# Patient Record
Sex: Female | Born: 1960 | Race: White | Hispanic: No | Marital: Married | Smoking: Former smoker
Health system: Southern US, Community
[De-identification: ages and names within clinical notes are randomized; demographics above are authoritative.]

## PROBLEM LIST (undated history)

## (undated) DIAGNOSIS — E119 Type 2 diabetes mellitus without complications: Secondary | ICD-10-CM

## (undated) DIAGNOSIS — R609 Edema, unspecified: Secondary | ICD-10-CM

## (undated) DIAGNOSIS — K219 Gastro-esophageal reflux disease without esophagitis: Secondary | ICD-10-CM

## (undated) DIAGNOSIS — J309 Allergic rhinitis, unspecified: Secondary | ICD-10-CM

## (undated) DIAGNOSIS — F419 Anxiety disorder, unspecified: Secondary | ICD-10-CM

## (undated) DIAGNOSIS — E7800 Pure hypercholesterolemia, unspecified: Secondary | ICD-10-CM

## (undated) DIAGNOSIS — E785 Hyperlipidemia, unspecified: Secondary | ICD-10-CM

## (undated) DIAGNOSIS — K760 Fatty (change of) liver, not elsewhere classified: Secondary | ICD-10-CM

## (undated) DIAGNOSIS — I10 Essential (primary) hypertension: Secondary | ICD-10-CM

## (undated) DIAGNOSIS — U071 COVID-19: Secondary | ICD-10-CM

## (undated) DIAGNOSIS — H9319 Tinnitus, unspecified ear: Secondary | ICD-10-CM

## (undated) DIAGNOSIS — I700 Atherosclerosis of aorta: Secondary | ICD-10-CM

## (undated) DIAGNOSIS — E559 Vitamin D deficiency, unspecified: Secondary | ICD-10-CM

## (undated) HISTORY — DX: Type 2 diabetes mellitus without complications: E11.9

## (undated) HISTORY — DX: Hyperlipidemia, unspecified: E78.5

## (undated) HISTORY — DX: Atherosclerosis of aorta: I70.0

## (undated) HISTORY — PX: HERNIA REPAIR: SHX51

## (undated) HISTORY — DX: Morbid (severe) obesity due to excess calories: E66.01

## (undated) HISTORY — DX: Pure hypercholesterolemia, unspecified: E78.00

## (undated) HISTORY — DX: COVID-19: U07.1

## (undated) HISTORY — DX: Essential (primary) hypertension: I10

## (undated) HISTORY — DX: Tinnitus, unspecified ear: H93.19

## (undated) HISTORY — DX: Edema, unspecified: R60.9

## (undated) HISTORY — DX: Vitamin D deficiency, unspecified: E55.9

## (undated) HISTORY — DX: Allergic rhinitis, unspecified: J30.9

## (undated) HISTORY — DX: Anxiety disorder, unspecified: F41.9

## (undated) HISTORY — DX: Fatty (change of) liver, not elsewhere classified: K76.0

## (undated) HISTORY — DX: Gastro-esophageal reflux disease without esophagitis: K21.9

## (undated) HISTORY — PX: BACK SURGERY: SHX140

## (undated) HISTORY — PX: PARTIAL HYSTERECTOMY: SHX80

## (undated) HISTORY — PX: CHOLECYSTECTOMY: SHX55

---

## 1998-04-05 ENCOUNTER — Ambulatory Visit (HOSPITAL_COMMUNITY): Admission: RE | Admit: 1998-04-05 | Discharge: 1998-04-05 | Payer: Self-pay | Admitting: Family Medicine

## 1999-09-24 ENCOUNTER — Encounter: Admission: RE | Admit: 1999-09-24 | Discharge: 1999-09-24 | Payer: Self-pay | Admitting: Family Medicine

## 1999-09-24 ENCOUNTER — Encounter: Payer: Self-pay | Admitting: Family Medicine

## 2000-12-06 ENCOUNTER — Other Ambulatory Visit: Admission: RE | Admit: 2000-12-06 | Discharge: 2000-12-06 | Payer: Self-pay | Admitting: Obstetrics and Gynecology

## 2001-02-24 ENCOUNTER — Encounter (INDEPENDENT_AMBULATORY_CARE_PROVIDER_SITE_OTHER): Payer: Self-pay | Admitting: Specialist

## 2001-02-24 ENCOUNTER — Inpatient Hospital Stay (HOSPITAL_COMMUNITY): Admission: RE | Admit: 2001-02-24 | Discharge: 2001-02-26 | Payer: Self-pay | Admitting: Obstetrics and Gynecology

## 2002-09-06 ENCOUNTER — Encounter: Payer: Self-pay | Admitting: Orthopedic Surgery

## 2002-09-07 ENCOUNTER — Inpatient Hospital Stay (HOSPITAL_COMMUNITY): Admission: RE | Admit: 2002-09-07 | Discharge: 2002-09-08 | Payer: Self-pay | Admitting: Orthopedic Surgery

## 2003-11-12 ENCOUNTER — Other Ambulatory Visit: Admission: RE | Admit: 2003-11-12 | Discharge: 2003-11-12 | Payer: Self-pay | Admitting: Family Medicine

## 2004-10-30 ENCOUNTER — Other Ambulatory Visit: Admission: RE | Admit: 2004-10-30 | Discharge: 2004-10-30 | Payer: Self-pay | Admitting: Obstetrics and Gynecology

## 2005-01-09 ENCOUNTER — Encounter: Admission: RE | Admit: 2005-01-09 | Discharge: 2005-01-09 | Payer: Self-pay | Admitting: Obstetrics and Gynecology

## 2006-03-15 ENCOUNTER — Ambulatory Visit (HOSPITAL_COMMUNITY): Admission: RE | Admit: 2006-03-15 | Discharge: 2006-03-15 | Payer: Self-pay | Admitting: Gastroenterology

## 2006-03-17 ENCOUNTER — Emergency Department (HOSPITAL_COMMUNITY): Admission: EM | Admit: 2006-03-17 | Discharge: 2006-03-17 | Payer: Self-pay | Admitting: Emergency Medicine

## 2010-08-24 ENCOUNTER — Encounter: Payer: Self-pay | Admitting: Obstetrics and Gynecology

## 2010-12-19 NOTE — Op Note (Signed)
[facility]  Patient:    Brenda Robinson, Brenda Robinson [facility]                    MRN: [id number] Proc. Date: 02/24/01 Attending:  Guy Sandifer. Arleta Creek, M.D.                           Operative Report  PREOPERATIVE DIAGNOSES: 1. Dysmenorrhea. 2. Cystocele and rectocele.  POSTOPERATIVE DIAGNOSES: 1. Dysmenorrhea. 2. Cystocele and rectocele.  PROCEDURE:  Laparoscopically assisted vaginal hysterectomy with anterior posterior vaginal repair.  SURGEON:  Guy Sandifer. Arleta Creek, M.D.  ASSISTANT:  Raynald Kemp, M.D.  ANESTHESIA:  General with endotracheal intubation.  ESTIMATED BLOOD LOSS:  150 cc.  INDICATIONS AND CONSENTS:  The patient is a 50 year old married white female G2, P2, husband status post vasectomy with decreasingly severe premenstrual pain and dysmenorrhea as well as symptoms of cystocele and rectocele. She is having increasing difficulty as well evacuating her bowels. Laparoscopically assisted vaginal hysterectomy and anterior posterior vaginal repair is discussed. An ovary will be removed only if distinctly abnormal. Potential complications have been discussed including but not limited to infection, bowel, bladder and ureteral damage, bleeding requiring transfusion of blood products with possible transfusion reaction HIV and hepatitis acquisition, DVT, PE, pneumonia, fistula formation, laparotomy, and postoperative dyspareunia. All questions are answered and consent is signed on the chart.  FINDINGS:  Upper abdomen is grossly normal. The uterus is retroverted and large to approximately 10 week size. Ovaries are normal. Anterior posterior cul-de-sacs and pelvic sidewalls are normal.  DESCRIPTION OF PROCEDURE:  The patient is taken to the operating room, placed in the dorsal supine position where general anesthesia is induced via endotracheal intubation. She was then placed in the dorsal lithotomy position where she was prepped abdominally and vaginally.  The bladder was straight catheterized, Hulka tenaculum was placed in the uterus as a manipulator and she is draped in a sterile fashion. A small infraumbilical incision is made and a 10/11 disposable trocar sleeve is placed on the first attempt without difficulty. Placement is verified with the laparoscope and no damage to the surrounding structures is noted. Pneumoperitoneum is induced and then a small suprapubic and later left lower quadrant incisions are made after transillumination and 5 mm nondisposable trocar sleeves are placed under direct visualization without difficulty. The above findings are noted. Then using the 5 mm laparoscope to the left lower quadrant trocar sleeve, the ligasure instrument is used to take down the proximal ligaments bilaterally. Switching back to the operative laparoscope, the vesicouterine peritoneum is incised in the midline, hydrodissected and taken down cephalolaterally. Good hemostasis is noted. Instruments are removed and attention is turned to the vagina. The posterior cul-de-sac is entered sharply, the cervix is circumscribed with a scalpel and the mucosa is advanced bluntly and sharply. The uterosacral ligaments followed by the bladder pillars and the cardinal ligaments are taken down bilaterally with a ligasure instrument. This is followed by the uterine vessels and one bite above the level of the uterine vessels bilaterally. The fundus is delivered posterior, the proximal ligaments are clamped and the specimen is delivered. Bleeding at the 3 oclock position of the cuff is controlled with a figure-of-eight #0 monocryl suture. All suture will be #0 monocryl unless otherwise designated. The uterosacral ligaments are then plicated in the vaginal cuff bilaterally and are then plicated in the midline with a single suture. The posterior half of the vaginal cuff is closed.  Good hemostasis is noted. The anterior vaginal mucosa is then dissected from the  underlying bladder in the midline sharply. This is then carried out bilaterally, sharply and bluntly. The base of the bladder is then reduced with a single pursestring suture and then the vesicovaginal fascia is reapproximated in the midline with interrupted sutures. At this point, Dr. Annabell Howells enters the room and performs his pubovaginal sling procedure and this is dictated in a separate note. At this point, I resumed surgery and the anterior half of the vaginal cuff is closed with figure-of-eight #0 monocryls. Excess vaginal mucosa is trimmed and the anterior vaginal mucosa is closed in a running locking fashion with 2-0 monocryl suture. Then a posterior vaginal repair is performed first removing a shallow diamond shaped wedge of tissue over the perineal body and dissecting the posterior vaginal mucosa from the underlying rectum in the midline. This is again dissected bilaterally, sharply and bluntly. The rectovaginal fascia is reapproximated in the midline with interrupted #0 monocryl. Excess tissue was removed and the vaginal mucosa is reapproximated in running locking fashion with 2-0 monocryl suture down to the level of the vaginal inlets. The perineal body is then dissected out bilaterally and reapproximated with interrupted #0 monocryl suture. The 2-0 suture is then used to continue closure of the mucosa and skin in a standard episiotomy type fashion. Good hemostasis is again noted. One inch iodoform packing is then placed in the vagina. Returning attention to the abdomen, pneumoperitoneum is recreated and careful irrigation inspection is carried out. Some bleeding at the level of the anterior vaginal cuff is controlled with bipolar cautery without difficulty. Excess fluid is removed, pneumoperitoneum is reduced and no bleeding is noted from any site. The inferior trocar sleeves are removed and again no bleeding is noted under reduced pneumoperitoneum. Pneumoperitoneum is then  completely reduced and all trocar sleeves are removed. The umbilical incision is closed first with a #0 Vicryl and the deeper underlying tissues with care being taken not to pick up  any underlying structures. The skin incisions are closed with subcuticular 3-0 Vicryl suture. The incisions are injected with 0.5% plain Marcaine. All counts are correct. The patient is awakened and taken to the recovery room in stable condition. DD:  02/24/01 TD:  02/24/01 Job: [id number]

## 2010-12-19 NOTE — Op Note (Signed)
[name], [name]                       ACCOUNT NO.:  [id number]   MEDICAL RECORD NO.:  [id number]                   PATIENT TYPE:  AMB   LOCATION:  DAY                                  FACILITY:  [facility]   PHYSICIAN:  Georges Lynch. Darrelyn Hillock, M.D.             DATE OF BIRTH:  [birth year]   DATE OF PROCEDURE:  09/06/2002  DATE OF DISCHARGE:                                 OPERATIVE REPORT   SURGEON:  Georges Lynch. Darrelyn Hillock, M.D.   ASSISTANT:  Jene Every, M.D.   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 Note little history, she had severe pain in her back radiating to both  buttocks and down both legs and the plantar aspect of both feet.   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 Note little history, she had severe pain in her back radiating to both  buttocks and down both legs and the plantar aspect of both feet.   OPERATION:  1. Bilateral hemilaminectomies L5-S1.  2. Microdiskectomies bilaterally at L5-S1 with foraminotomies bilaterally.   DESCRIPTION OF PROCEDURE:  Under general anesthesia the patient on a spinal  frame, a routine orthopedic prep and draping of the back was carried out.  The patient had 1 gm of IV Ancef preop. Two needles were placed in the back  for localization purposes, an x-ray was taken and we verified the L5-S1  position. We then made our incision over L5-S1, carried the incision down to  the lamina bilaterally of L5-S1 and to the sacrum. Another x-ray was taken  to verify our position. We then went down and carried out bilateral  hemilaminectomies at this time utilizing the Kerrison rongeurs. Great care  was taken not to injure the underlying dura. We then removed the ligamentum  flavum. We did one side at a time. We first approached the left side. We  inserted the D'Errico retractors, gently retracted the dura, identified the  nerve root and then identified the lateral  recess veins and cauterized the  veins with the bipolar. We then identified the disk rupture, a cruciate  incision was made in the disk space and a complete diskectomy was carried  out in the left. We then went over to the right and did the exact same  thing. We did foraminotomies bilaterally. We thoroughly decompressed the  nerve roots. We utilized the Epstein curettes to go up into the midline to  make sure there were no other fragments in the midline. We did a nice  diskectomy, we thoroughly removed all the disk material, we cleaned out the  area well, irrigated the area, dried the area out and then loosely applied  some thrombin soaked Gelfoam for hemostasis purposes. I then closed the  wound in layers in the usual fashion. I left the superior deep portion of  the fascia open for drainage purposes in case  there was any bleeding after  the procedure. We then closed the remaining part of the wound in layers in  the usual fashion. A sterile Neosporin dressing was applied. The patient  left the operating room in satisfactory condition.                                               Ronald A. Darrelyn Hillock, M.D.    RAG/MEDQ  D:  09/06/2002  T:  09/06/2002  Job:  [id number]

## 2010-12-19 NOTE — Op Note (Signed)
[facility]  Patient:    Brenda Robinson, Brenda Robinson [facility]                    MRN: [id number] Proc. Date: 02/24/01 Attending:  Excell Seltzer. Annabell Howells, M.D. CC:         Guy Sandifer. Arleta Creek, M.D.   Operative Report  PROCEDURE:  Pubovaginal sling.  PREOPERATIVE DIAGNOSIS:  Stress incontinence.  POSTOPERATIVE DIAGNOSIS:  Stress incontinence.  SURGEON:  Excell Seltzer. Annabell Howells, M.D.  ASSISTANT:  Guy Sandifer. Arleta Creek, M.D.  ANESTHESIA:  General.  DRAINS:  Suprapubic tube.  COMPLICATIONS:  None.  INDICATIONS:  Ms. Brenda Robinson is a 50 year old white female with dysmenorrhea and premenstrual pain who is to undergo hysterectomy.  She has had progressive stress incontinence and has elected to have a sling procedure done at the time of her hysterectomy and AP repair.  FINDINGS AT PROCEDURE:  The patient had been given preoperative antibiotics and taken to the operating room where a general anesthetic was induced.  She was placed in the lithotomy position.  A laparoscopically-assisted vaginal hysterectomy had been performed by Dr. Henderson Cloud.  An anterior repair was performed, the left anterior vaginal opened. I then came into the procedure.  A Foley catheter was inserted, and the bladder was drained.  A transverse incision was made just superior to the pubis in the midline, measuring approximately 3 cm in length.  The fat was spread to the fascia with a Kelly, and a moist sponge was placed in the wound. A 2 x 8 cm Dermatrix strip was soaked and prepared, and then a #1 Prolene was placed thorough each end with a quadruple helical stitch.  Once the strip was prepared, vaginal dissection was performed.  The anterior vaginal wall incision was extended out towared the mid urethral level.  The pubourethral fascia was initially pierced on the right side at its junction with the pubis, and a finger was passed in the retropubic space.  This was then repeated on the left side.  Once the dissection had  been completed, the Raz needle was passed on the right side from the abdominal incision down to the vaginal incision under finger guidance.  Once in the vaginal area, the suspension sutures were placed through the eye of the Raz needle and withdrawn to the abdominal incision.  This was then repeated on the left.  The Dermatrix strip was then tacked over the bladder neck and proximal urethra with 3-0 Vicryl tacking sutures.  Cystoscopy was then performed.  Examination revealed no evidence of bladder injury and good position of the sling.  The bladder was filled; the patient was placed in Trendelenburg, and a microvasive fader tip suprapubic tube was placed through a separate stab wound just superior to the suprapubic incision.  This tube was placed under cystoscope guidance.  Once in good position, the trocar was removed, and the coil was formed and secured, and the bladder was then drained.  The cystoscope sheath was left in the urethra to provide appropriate position reference.  The suspension sutures were then tied under minimal tension over hemostats and then tied across the midline to each other.  The suture was trimmed, and the knot was stuck back into the abdominal incision.  The abdominal incision was then closed with a running intracuticular 4-0 Vicryl stitch.  At this point, the suprapubic tube was placed to straight drainage, and Dr. Henderson Cloud then took over and completed his portion of the procedure.  There were no complications during  the sling procedure. DD:  02/24/01 TD:  02/24/01 Job: [id number]

## 2010-12-19 NOTE — H&P (Signed)
Brenda Robinson, Brenda Robinson [facility]                       ACCOUNT NO.:  [id number]   MEDICAL RECORD NO.:  [id number]                   PATIENT TYPE:  AMB   LOCATION:  DAY                                  FACILITY:  [facility]   PHYSICIAN:  Georges Lynch. Gioffre, M.D.             DATE OF BIRTH:  10/23/1960   DATE OF ADMISSION:  DATE OF DISCHARGE:                                HISTORY & PHYSICAL   HISTORY:  The patient has had low back pain for the past 3-4 months. The  patient initially started having back pain in August 2003, pain worsened in  October and she sought care from Dr. Darrelyn Hillock in November. The pain has been  increasing in severity, radiates into her buttocks and down into both legs.  Describes the pain as a burning type sensation. The patient had an MRI back  in 2002 and she had a small disk bulge at that time. It was felt that she  would need to be restudied in order to see if she had increasing disk bulge  or herniation which is causing her back pain. Due to the patient's  progressive symptoms, increasing pain, an MRI was ordered which revealed  left L5-S1 disk herniation with inferior extrusion resulting in impingement  on the left S1 nerve root. The patient has elected to proceed with surgery.   ALLERGIES:  DILAUDID causes itching, CODEINE causes itching.   CURRENT MEDICATIONS:  1. Ultracet.  2. Robaxin.  3. Percocet.  4. Prilosec 20 mg q.a.m.  5. Prozac 20 mg daily.   PAST MEDICAL HISTORY:  1. Gastroesophageal reflux disease.  2. Depression.   PAST SURGICAL HISTORY:  The patient had a hysterectomy with bladder repair  in July of 2002.   PRIMARY CARE PHYSICIAN:  Brenda Robinson, M.D.   SOCIAL HISTORY:  The patient is married, works in a [employer], does not  smoke. Minimal alcohol use.   FAMILY HISTORY:  Mother has hypertension.   REVIEW OF SYMPTOMS:  GENERAL:  Denies weight change, fever, chills, fatigue.  HEENT:  Denies headache, visual changes, tinnitus, hearing  loss, sore  throat. CARDIOVASCULAR:  Denies chest pain, palpitations, shortness of  breath, hypertension. PULMONARY:  Denies dyspnea, wheezing, cough,  hemoptysis or  sputum production. GI:  Denies dysphagia, nausea, vomiting,  hematemesis or abdominal pain. GU:  Denies dysuria, frequency, incontinence,  nocturia, or hematuria. ENDOCRINE:  Denies polyuria, polydipsia, appetite  change, heat or cold intolerance. MUSCULOSKELETAL:  Does have bilateral leg  pain radiating from her back. NEUROLOGIC:  Denies dizziness, vertigo,  syncope, numbness, weakness or paresthesias. SKIN:  Denies itching, rashes,  masses or moles.   PHYSICAL EXAMINATION:  VITAL SIGNS:  Temperature 98.4, pulse 84,  respirations 18, blood pressure 120/84 right arm sitting.  GENERAL:  A 50 year old female in no acute distress.  HEENT:  PERRL.  EOM intact. Pharynx clear.  NECK:  Supple without masses.  CHEST:  Clear to  auscultation bilaterally. No wheezing, rales or rhonchi  noted.  HEART:  Regular rate and rhythm without murmur, gallop or rub.  ABDOMEN:  Positive bowel sounds, soft, nontender, no organomegaly or  abnormal masses.  BACK:  The patient has decreased range of motion.  EXTREMITIES:  Straight leg raise is negative bilaterally. DTRs are 2+ all  extremities.  SKIN:  Warm and dry.   MRI revealed left L5-S1 disk herniation with inferior extrusion resulting in  impingement on the left S1 nerve root.   IMPRESSION:  Herniated disk L5-S1.   PLAN:  The patient is to be admitted to [facility] to undergo  bilateral hemilaminectomies and microdiskectomies at L5-S1. The patient will  be admitted September 06, 2002.     Ebbie Ridge. Paitsel, P.A.                     Ronald A. Darrelyn Hillock, M.D.    Tilden Dome  D:  09/05/2002  T:  09/05/2002  Job:  [id number]

## 2010-12-19 NOTE — H&P (Signed)
[facility]  Patient:    Brenda Robinson, Brenda Robinson                        MRN: [id number] Adm. Date:  02/24/01 Attending:  Guy Sandifer. Arleta Creek, M.D.                         History and Physical  CHIEF COMPLAINT:   Pelvic pain, dysmenorrhea and stress urinary incontinence.  HISTORY OF PRESENT ILLNESS:  This patient is a 50 year old married white female, G2, P2, and her husband is status post vasectomy who has increasingly bad premenstrual pain and dysmenorrhea.  Pain begins 2 weeks before menses and becomes severe during her menstrual flow.  She has 1 week out of the month where she feels okay.  She also has increasingly severe stress incontinence where she is regularly soiling her clothes.  After careful discussion of the options the patient is being admitted for laparoscopically assisted vaginal hysterectomy with removal of an ovary only if distinctly abnormal, anterior posterior repair and a sling procedure per Dr. Bjorn Pippin.  PAST MEDICAL HISTORY:  Esophageal reflux disease.  PAST SURGICAL HISTORY:  Dilation of urethra at age 83.  Tonsillectomy at age 45.  OBSTETRICAL HISTORY:  Vaginal delivery x 2.  SOCIAL HISTORY:  The patient quit smoking 5 years ago, denies alcohol or drug abuse.  MEDICATIONS: 1. Prevacid daily. 2. Effexor 75 mg daily.  ALLERGIES:  No known drug allergies.  FAMILY HISTORY:  Positive for basal cell carcinoma, maternal grandmother. Chronic hypertension mother.  Coronary artery disease paternal grandmother. Coronary artery disease maternal uncle.  Arrhythmia maternal aunt.  REVIEW OF SYSTEMS:  Negative except as above.  PHYSICAL EXAMINATION:  VITAL SIGNS:  Height 5 feet 2-1/2 inches.  Weight 160 pounds.  Blood pressure 120/72.  HEENT:  Without thyromegaly.  LUNGS:  Clear to auscultation.  HEART:  Regular rate and rhythm.  BACK:  No CVA tenderness.  BREASTS:  Without masses, retraction, discharge.  ABDOMEN:  Soft,  nontender without masses.  PELVIC EXAMINATION:  Vulva, vagina, cervix without lesions.  First degree cystocele.  Uterus is anteverted and normal in size with first degree prolapse at most.  Adnexa nontender without masses.  Rectal exam reveals a small rectocele.  There is some laxity in the vaginal outlet.  ASSESSMENT:  Premenstrual pain, dysmenorrhea, cystocele, stress urinary incontinence.  PLAN:  Microscopically assisted vaginal hysterectomy with removal of an ovary only if distinctly abnormal.  Anterior posterior vaginal repair and a sling procedure per Dr. Bjorn Pippin. DD:  02/02/01 TD:  02/02/01 Job: [id number]

## 2010-12-19 NOTE — Discharge Summary (Signed)
[facility]  Patient:    Brenda Robinson, Brenda Robinson [facility]                    MRN: [id number] Adm. Date:  [id number] Disc. Date: [id number] Attending:  Cordelia Pen Ii CC:         Excell Seltzer. Annabell Howells, M.D.   Discharge Summary  ADMITTING DIAGNOSES:  Dysmenorrhea, cystocele, rectocele, and stress urinary incontinence.  DISCHARGE DIAGNOSES:  Dysmenorrhea, cystocele, rectocele, and stress urinary incontinence.  PROCEDURES:  On February 24, 2001, laparoscopic-assisted vaginal hysterectomy, anterior and posterior vaginal repair, and pubovaginal sling.  REASON FOR ADMISSION:  This patient is a 50 year old white married female, G2, P2, husband status post vasectomy with increasingly severe dysmenorrhea, stress urinary incontinence, and symptoms of cystocele and rectocele.  Details are dictated in the history and physical.  HOSPITAL COURSE:  She was admitted to the hospital and underwent the above procedures without complication.  Estimated blood loss was 150 cc.  On the evening of surgery, she was afebrile with good pain relief.  She did have itching with Dilaudid which was resolved when she was switched to morphine. On the first postoperative day, she was afebrile.  Hemoglobin was 9.1, white count 8.7.  On the day of discharge, she was doing well.  Voiding trials were going well.  She was ambulating and tolerating a regular diet.  CONDITION ON DISCHARGE:  Good.  DIET:  Regular as tolerated.  ACTIVITY:  No lifting or operation of automobiles, no vaginal entry.  She is to call the office for problems including, but not limited to, temperature of 100.4 degrees, persistent nausea or vomiting, increasing pain, or heavy vaginal bleeding.  FOLLOW-UP:  In the office in two weeks.  DISCHARGE MEDICATIONS: 1. Percocet 5/325 #30, 1-2 p.o. q.6h. p.r.n. no refills. 2. Bactrim DS 1 p.o. b.i.d. #14 no refills. 3. Restoril 30 mg #20, 1 p.o. q.h.s. p.r.n. no refills. DD:   02/26/01 TD:  02/27/01 Job: [id number]

## 2010-12-19 NOTE — Op Note (Signed)
NAME:  Brenda Robinson, Brenda Robinson               ACCOUNT NO.:  [id number]   MEDICAL RECORD NO.:  [id number]          PATIENT TYPE:  AMB   LOCATION:  ENDO                         FACILITY:  [facility]   PHYSICIAN:  Anselmo Rod, M.D.  DATE OF BIRTH:  August 02, 1961   DATE OF PROCEDURE:  03/15/2006  DATE OF DISCHARGE:                                 OPERATIVE REPORT   PROCEDURE:  Screening colonoscopy.   ENDOSCOPIST:  Anselmo Rod, M.D.   INSTRUMENT USED:  Olympus video colonoscope.   INDICATION FOR PROCEDURE:  A 50 year old white female with history of change  in bowel habits, undergoing a screening colonoscopy to rule out colonic  polyps, masses, etc.   PREPROCEDURE PREPARATION:  Informed was procured from the patient, and  patient was fasted for 4 hours prior to the procedure, and prepped with 32  OsmoPrep pills the night of and the morning of the procedure.  Risks and  benefits of the procedure, including a 10% miss rate of cancer and polyp,  were discussed with the patient, as well as.   PREPROCEDURE PHYSICAL:  Patient had stable vital signs.  NECK:  Supple.  CHEST:  Clear to auscultation.  S1-S2 regular.  ABDOMEN:  Soft with normal bowel sounds.   DESCRIPTION OF PROCEDURE:  The patient was placed in the left lateral  decubitus position and sedated with 150 mcg of Fentanyl and 12.5 mg of  Versed in slow incremental doses. Once the patient was adequately sedated  and maintained on low flow oxygen and continuous cardiac monitoring, the  Olympus video colonoscope was advanced from the rectum to cecum.  The  appendical orifice and ileocecal valve was clearly visualized and  photographed. The terminal ileum appeared healthy without lesions.  Multiple  washes were done.  The entire colonic mucosa appeared healthy with a normal  vascular pattern.  Retroflexion in the rectum revealed no abnormalities.  No  masses, polyps, erosions, ulcerations or diverticula were seen.  The patient  tolerated the  procedure well without immediate complications.   IMPRESSION:  Normal colonoscopy up to the terminal ileum.  No masses, polyps  or diverticular seen.   RECOMMENDATIONS:  1.Continue high fiber diet with liberal fluid intake.  2.Repeat colonoscopy in the next 10 years unless the patient develops any  abnormal symptoms in the interim.  3.Outpatient follow up as need arises in the future.      Anselmo Rod, M.D.  Electronically Signed     JNM/MEDQ  D:  03/15/2006  T:  03/15/2006  Job:  [id number]   cc:   Marcelino Duster L. Vincente Poli, M.D.  Royetta Crochet, MD

## 2011-04-06 ENCOUNTER — Inpatient Hospital Stay (INDEPENDENT_AMBULATORY_CARE_PROVIDER_SITE_OTHER)
Admission: RE | Admit: 2011-04-06 | Discharge: 2011-04-06 | Disposition: A | Discharge status: Home / Self Care | Payer: Self-pay | Source: Ambulatory Visit | Attending: Emergency Medicine | Admitting: Emergency Medicine

## 2011-04-06 ENCOUNTER — Ambulatory Visit (INDEPENDENT_AMBULATORY_CARE_PROVIDER_SITE_OTHER): Payer: Self-pay

## 2011-04-06 DIAGNOSIS — R05 Cough: Secondary | ICD-10-CM

## 2011-04-06 DIAGNOSIS — R5381 Other malaise: Secondary | ICD-10-CM

## 2011-04-06 DIAGNOSIS — E876 Hypokalemia: Secondary | ICD-10-CM

## 2011-04-06 LAB — POCT I-STAT, CHEM 8
Calcium, Ion: 1.09 mmol/L — ABNORMAL LOW (ref 1.12–1.32)
Chloride: 98 mEq/L (ref 96–112)
Glucose, Bld: 92 mg/dL (ref 70–99)
HCT: 48 % — ABNORMAL HIGH (ref 36.0–46.0)
Hemoglobin: 16.3 g/dL — ABNORMAL HIGH (ref 12.0–15.0)
TCO2: 30 mmol/L (ref 0–100)

## 2013-02-23 ENCOUNTER — Other Ambulatory Visit: Payer: Self-pay | Admitting: Physician Assistant

## 2013-02-23 ENCOUNTER — Other Ambulatory Visit (HOSPITAL_COMMUNITY)
Admission: RE | Admit: 2013-02-23 | Discharge: 2013-02-23 | Disposition: A | Discharge status: Home / Self Care | Payer: No Typology Code available for payment source | Source: Ambulatory Visit | Attending: Family Medicine | Admitting: Family Medicine

## 2013-02-23 DIAGNOSIS — Z0000 Encounter for general adult medical examination without abnormal findings: Secondary | ICD-10-CM | POA: Insufficient documentation

## 2014-09-24 ENCOUNTER — Other Ambulatory Visit: Payer: Self-pay | Admitting: Otolaryngology

## 2014-09-24 DIAGNOSIS — H902 Conductive hearing loss, unspecified: Secondary | ICD-10-CM

## 2014-09-24 DIAGNOSIS — H9312 Tinnitus, left ear: Secondary | ICD-10-CM

## 2014-10-01 ENCOUNTER — Ambulatory Visit
Admission: RE | Admit: 2014-10-01 | Discharge: 2014-10-01 | Disposition: A | Discharge status: Home / Self Care | Payer: BLUE CROSS/BLUE SHIELD | Source: Ambulatory Visit | Attending: Otolaryngology | Admitting: Otolaryngology

## 2014-10-01 DIAGNOSIS — H902 Conductive hearing loss, unspecified: Secondary | ICD-10-CM

## 2014-10-01 DIAGNOSIS — H9312 Tinnitus, left ear: Secondary | ICD-10-CM

## 2016-03-30 ENCOUNTER — Other Ambulatory Visit: Payer: Self-pay | Admitting: Otolaryngology

## 2016-03-30 DIAGNOSIS — R2689 Other abnormalities of gait and mobility: Secondary | ICD-10-CM

## 2016-03-30 DIAGNOSIS — H93A2 Pulsatile tinnitus, left ear: Secondary | ICD-10-CM

## 2016-03-30 DIAGNOSIS — R442 Other hallucinations: Secondary | ICD-10-CM

## 2016-04-14 ENCOUNTER — Ambulatory Visit
Admission: RE | Admit: 2016-04-14 | Discharge: 2016-04-14 | Disposition: A | Discharge status: Home / Self Care | Payer: BLUE CROSS/BLUE SHIELD | Source: Ambulatory Visit | Attending: Otolaryngology | Admitting: Otolaryngology

## 2016-04-14 DIAGNOSIS — H93A2 Pulsatile tinnitus, left ear: Secondary | ICD-10-CM

## 2016-04-14 DIAGNOSIS — R2689 Other abnormalities of gait and mobility: Secondary | ICD-10-CM

## 2016-04-14 DIAGNOSIS — R442 Other hallucinations: Secondary | ICD-10-CM

## 2016-04-14 MED ORDER — GADOBENATE DIMEGLUMINE 529 MG/ML IV SOLN
17.0000 mL | Freq: Once | INTRAVENOUS | Status: AC | PRN
  Administered 2016-04-14: 17 mL via INTRAVENOUS

## 2017-04-13 ENCOUNTER — Other Ambulatory Visit: Payer: Self-pay | Admitting: Sports Medicine

## 2017-04-13 DIAGNOSIS — M25562 Pain in left knee: Secondary | ICD-10-CM

## 2017-04-26 ENCOUNTER — Ambulatory Visit
Admission: RE | Admit: 2017-04-26 | Discharge: 2017-04-26 | Disposition: A | Discharge status: Home / Self Care | Payer: 59 | Source: Ambulatory Visit | Attending: Sports Medicine | Admitting: Sports Medicine

## 2017-04-26 DIAGNOSIS — M25562 Pain in left knee: Secondary | ICD-10-CM

## 2018-08-15 ENCOUNTER — Ambulatory Visit
Admission: EM | Admit: 2018-08-15 | Discharge: 2018-08-15 | Disposition: A | Discharge status: Home / Self Care | Payer: 59

## 2018-08-15 DIAGNOSIS — J22 Unspecified acute lower respiratory infection: Secondary | ICD-10-CM | POA: Insufficient documentation

## 2018-08-15 MED ORDER — ALBUTEROL SULFATE HFA 108 (90 BASE) MCG/ACT IN AERS: 1.0000 | INHALATION_SPRAY | Freq: Four times a day (QID) | RESPIRATORY_TRACT | 0 refills | Status: AC | PRN

## 2018-08-15 MED ORDER — AMOXICILLIN 875 MG PO TABS: 875.0000 mg | ORAL_TABLET | Freq: Two times a day (BID) | ORAL | 0 refills | Status: DC

## 2018-08-15 MED ORDER — DM-GUAIFENESIN ER 30-600 MG PO TB12: 1.0000 | ORAL_TABLET | Freq: Two times a day (BID) | ORAL | 0 refills | Status: DC

## 2018-08-15 MED ORDER — ALBUTEROL SULFATE (2.5 MG/3ML) 0.083% IN NEBU
2.5000 mg | INHALATION_SOLUTION | Freq: Once | RESPIRATORY_TRACT | Status: AC
  Administered 2018-08-15: 2.5 mg via RESPIRATORY_TRACT

## 2018-08-15 MED ORDER — BENZONATATE 200 MG PO CAPS: 200.0000 mg | ORAL_CAPSULE | Freq: Two times a day (BID) | ORAL | 0 refills | Status: DC | PRN

## 2018-08-15 NOTE — Discharge Instructions (Signed)
You need to get plenty of rest.  Push fluids. Use a humidifier if you have one Use the inhaler every 4 hours as needed for wheezing Take a Tessalon and Mucinex DM every 12 hours for coughing Tylenol or ibuprofen for pain or fever Fill and take the antibiotic if you fail to improve over the next few days or have worsening symptoms

## 2018-08-15 NOTE — ED Provider Notes (Signed)
[facility]    CSN: [id number] Arrival date & time: 08/15/18  1008     History   Chief Complaint Chief Complaint  Patient presents with  . Influenza    HPI Brenda Robinson is a 58 y.o. female.   HPI  Patient states that she was around her granddaughter who has influenza.  Since last Friday she has been having cough cold runny nose body aches and fever.  She states that she felt a little bit yesterday and try to do some housework, and then was exhausted from the effort.  She still has a fever today.  She states she has a continual cough and she feels like she is wheezing.  She does not have any underlying asthma or COPD.  Her chest hurts from coughing.  Her body aches from the fever.  She is using over-the-counter medicines with no relief  History reviewed. No pertinent past medical history.  There are no active problems to display for this patient.   History reviewed. No pertinent surgical history.  OB History   No obstetric history on file.      Home Medications    Prior to Admission medications   Medication Sig Start Date End Date Taking? Authorizing Provider  FLUoxetine (PROZAC) 10 MG tablet Take 10 mg by mouth daily.   Yes [provider]  triamterene-hydrochlorothiazide (MAXZIDE-25) 37.5-25 MG tablet Take 1 tablet by mouth daily.   Yes [provider]  albuterol (PROVENTIL HFA;VENTOLIN HFA) 108 (90 Base) MCG/ACT inhaler Inhale 1-2 puffs into the lungs every 6 (six) hours as needed for wheezing or shortness of breath. 08/15/18   Eustace Moore, MD  amoxicillin (AMOXIL) 875 MG tablet Take 1 tablet (875 mg total) by mouth 2 (two) times daily. 08/15/18   Eustace Moore, MD  benzonatate (TESSALON) 200 MG capsule Take 1 capsule (200 mg total) by mouth 2 (two) times daily as needed for cough. 08/15/18   Eustace Moore, MD  dextromethorphan-guaiFENesin [facility] DM) 30-600 MG 12hr tablet Take 1 tablet by mouth 2 (two) times daily.  08/15/18   Eustace Moore, MD    Family History No family history on file.  Social History Social History   Tobacco Use  . Smoking status: Never Smoker  . Smokeless tobacco: Never Used  Substance Use Topics  . Alcohol use: Yes  . Drug use: Not on file     Allergies   Wellbutrin [bupropion]   Review of Systems Review of Systems  Constitutional: Positive for appetite change, chills, fatigue and fever.  HENT: Positive for congestion, postnasal drip and rhinorrhea. Negative for ear pain and sore throat.   Eyes: Negative for pain and visual disturbance.  Respiratory: Positive for cough, shortness of breath and wheezing.   Cardiovascular: Negative for chest pain and palpitations.  Gastrointestinal: Negative for abdominal pain and vomiting.  Genitourinary: Negative for dysuria and hematuria.  Musculoskeletal: Negative for arthralgias and back pain.  Skin: Negative for color change and rash.  Neurological: Negative for seizures and syncope.  All other systems reviewed and are negative.    Physical Exam Triage Vital Signs ED Triage Vitals  Enc Vitals Group     BP 08/15/18 1020 118/77     Pulse Rate 08/15/18 1020 (!) 110     Resp 08/15/18 1020 18     Temp 08/15/18 1020 100.2 F (37.9 C)     Temp src --      SpO2 08/15/18 1020 98 %  Weight --      Height --      Head Circumference --      Peak Flow --      Pain Score 08/15/18 1021 0     Pain Loc --      Pain Edu? --      Excl. in GC? --    No data found.  Updated Vital Signs BP 118/77 (BP Location: Left Arm)   Pulse (!) 110   Temp 100.2 F (37.9 C)   Resp 18   SpO2 98%     Physical Exam Constitutional:      General: She is not in acute distress.    Appearance: She is well-developed. She is obese.     Comments: Appears tired  HENT:     Head: Normocephalic and atraumatic.     Right Ear: Tympanic membrane and ear canal normal.     Left Ear: Tympanic membrane and ear canal normal.     Nose:  Rhinorrhea present.     Comments: Clear rhinorrhea    Mouth/Throat:     Mouth: Mucous membranes are moist.     Pharynx: No oropharyngeal exudate or posterior oropharyngeal erythema.  Eyes:     Conjunctiva/sclera: Conjunctivae normal.     Pupils: Pupils are equal, round, and reactive to light.  Neck:     Musculoskeletal: Normal range of motion.  Cardiovascular:     Rate and Rhythm: Normal rate and regular rhythm.     Heart sounds: Normal heart sounds.  Pulmonary:     Effort: Pulmonary effort is normal. No respiratory distress.     Breath sounds: Wheezing and rhonchi present.     Comments: Wheezes throughout both lungs.  Patient unable to take a deep breath.  After nebulized treatment, wheezes are largely cleared.  Anterior rhonchi are heard.  No rales in bases Abdominal:     General: There is no distension.     Palpations: Abdomen is soft.     Tenderness: There is no abdominal tenderness.  Musculoskeletal: Normal range of motion.  Lymphadenopathy:     Cervical: No cervical adenopathy.  Skin:    General: Skin is warm and dry.  Neurological:     General: No focal deficit present.     Mental Status: She is alert. Mental status is at baseline.  Psychiatric:        Mood and Affect: Mood normal.        Thought Content: Thought content normal.      UC Treatments / Results  Labs (all labs ordered are listed, but only abnormal results are displayed) Labs Reviewed - No data to display  EKG None  Radiology No results found.  Procedures Procedures (including critical care time)  Medications Ordered in UC Medications  albuterol (PROVENTIL) (2.5 MG/3ML) 0.083% nebulizer solution 2.5 mg (2.5 mg Nebulization Given 08/15/18 1036)    Initial Impression / Assessment and Plan / UC Course  I have reviewed the triage vital signs and the nursing notes.  Pertinent labs & imaging results that were available during my care of the patient were reviewed by me and considered in my medical  decision making (see chart for details).    Likely remnants of influenza.  Discussed with patient that antibiotics are not going to help at this point.  I recommend symptomatic care.  Patient states that she always gets "bronchitis" and needs an antibiotic.  I explained to her that most bronchitis is caused by a virus and will  get better without an antibiotic.  She does agree to wait a couple more days with symptomatic care and fill and use for antibiotic only if she fails to improve by 8 to 10 days. Final Clinical Impressions(s) / UC Diagnoses   Final diagnoses:  LRTI (lower respiratory tract infection)     Discharge Instructions     You need to get plenty of rest.  Push fluids. Use a humidifier if you have one Use the inhaler every 4 hours as needed for wheezing Take a Tessalon and Mucinex DM every 12 hours for coughing Tylenol or ibuprofen for pain or fever Fill and take the antibiotic if you fail to improve over the next few days or have worsening symptoms   ED Prescriptions    Medication Sig Dispense Auth. Provider   benzonatate (TESSALON) 200 MG capsule Take 1 capsule (200 mg total) by mouth 2 (two) times daily as needed for cough. 20 capsule Eustace Moore, MD   dextromethorphan-guaiFENesin [facility] DM) 30-600 MG 12hr tablet Take 1 tablet by mouth 2 (two) times daily. 20 tablet Eustace Moore, MD   albuterol (PROVENTIL HFA;VENTOLIN HFA) 108 (90 Base) MCG/ACT inhaler Inhale 1-2 puffs into the lungs every 6 (six) hours as needed for wheezing or shortness of breath. 1 Inhaler Eustace Moore, MD   amoxicillin (AMOXIL) 875 MG tablet Take 1 tablet (875 mg total) by mouth 2 (two) times daily. 14 tablet Eustace Moore, MD     Controlled Substance Prescriptions [place] Controlled Substance Registry consulted? Not Applicable   Eustace Moore, MD 08/15/18 1054

## 2018-08-15 NOTE — ED Triage Notes (Signed)
Pt c/o flu sx since Thursday with cough and fever. Last tylenol at [facility]

## 2018-08-24 ENCOUNTER — Ambulatory Visit
Admission: EM | Admit: 2018-08-24 | Discharge: 2018-08-24 | Disposition: A | Discharge status: Home / Self Care | Payer: 59 | Attending: Family Medicine | Admitting: Family Medicine

## 2018-08-24 ENCOUNTER — Ambulatory Visit: Payer: 59

## 2018-08-24 ENCOUNTER — Encounter: Payer: Self-pay | Admitting: Emergency Medicine

## 2018-08-24 DIAGNOSIS — R062 Wheezing: Secondary | ICD-10-CM | POA: Diagnosis not present

## 2018-08-24 DIAGNOSIS — R05 Cough: Secondary | ICD-10-CM | POA: Diagnosis not present

## 2018-08-24 DIAGNOSIS — R053 Chronic cough: Secondary | ICD-10-CM

## 2018-08-24 MED ORDER — IPRATROPIUM-ALBUTEROL 0.5-2.5 (3) MG/3ML IN SOLN
3.0000 mL | Freq: Once | RESPIRATORY_TRACT | Status: AC
  Administered 2018-08-24: 3 mL via RESPIRATORY_TRACT

## 2018-08-24 MED ORDER — PREDNISONE 20 MG PO TABS: 20.0000 mg | ORAL_TABLET | Freq: Two times a day (BID) | ORAL | 0 refills | Status: DC

## 2018-08-24 MED ORDER — HYDROCOD POLST-CPM POLST ER 10-8 MG/5ML PO SUER: 5.0000 mL | Freq: Two times a day (BID) | ORAL | 0 refills | Status: DC | PRN

## 2018-08-24 MED ORDER — DEXAMETHASONE SODIUM PHOSPHATE 10 MG/ML IJ SOLN
10.0000 mg | Freq: Once | INTRAMUSCULAR | Status: AC
  Administered 2018-08-24: 10 mg via INTRAMUSCULAR

## 2018-08-24 NOTE — Discharge Instructions (Signed)
The cough medicine will be "cheratussin" It has a small amount of codeine so caution drowsiness Take the prednisone 2 x a day for 5 days Continue albuterol as directed Push fluids Use humidifier if you have one Follow up if no improvement

## 2018-08-24 NOTE — ED Triage Notes (Signed)
Pt states she was seen here on the 1/13 for flu and has had a cough since, states she finished her antibiotics, ran out of all her cough medicine. States the cough medicine helped "a little bit". States she feels tired and fatigued.

## 2018-08-24 NOTE — ED Provider Notes (Signed)
[facility]    CSN: [id number] Arrival date & time: 08/24/18  1724     History   Chief Complaint Chief Complaint  Patient presents with  . Cough    HPI Brenda Robinson is a 58 y.o. female.   HPI  Patient is here for follow-up.  She was seen by me on 08/15/2018 for flulike symptoms and cough.  She states that she has never quite gotten better.  She has a very harsh barking cough that is almost continuous.  She has no runny or stuffy nose.  She has no fever or chills.  She does feel slightly short of breath.  Her chest hurts from all the coughing.  No nausea or vomiting.  She is feeling tired.  No prior history of any asthma or lung disease.  No history of cigarette smoking.  She was given an albuterol inhaler and continues to use this for shortness of breath.  She did complete her antibiotics.  She also is no longer using the Tessalon, and notes that it did not work well for her cough.  History reviewed. No pertinent past medical history.  There are no active problems to display for this patient.   History reviewed. No pertinent surgical history.  OB History   No obstetric history on file.      Home Medications    Prior to Admission medications   Medication Sig Start Date End Date Taking? Authorizing Provider  albuterol (PROVENTIL HFA;VENTOLIN HFA) 108 (90 Base) MCG/ACT inhaler Inhale 1-2 puffs into the lungs every 6 (six) hours as needed for wheezing or shortness of breath. 08/15/18   Eustace Moore, MD  FLUoxetine (PROZAC) 10 MG tablet Take 10 mg by mouth daily.    [provider]  predniSONE (DELTASONE) 20 MG tablet Take 1 tablet (20 mg total) by mouth 2 (two) times daily with a meal. 08/24/18   Eustace Moore, MD  triamterene-hydrochlorothiazide (MAXZIDE-25) 37.5-25 MG tablet Take 1 tablet by mouth daily.    [provider]    Family History No family history on file.  Social History Social History   Tobacco Use  . Smoking  status: Never Smoker  . Smokeless tobacco: Never Used  Substance Use Topics  . Alcohol use: Yes  . Drug use: Not on file     Allergies   Wellbutrin [bupropion]   Review of Systems Review of Systems  Constitutional: Positive for fatigue. Negative for chills and fever.  HENT: Negative for ear pain and sore throat.   Eyes: Negative for pain and visual disturbance.  Respiratory: Positive for cough, chest tightness, shortness of breath and wheezing.   Cardiovascular: Positive for chest pain. Negative for palpitations.  Gastrointestinal: Negative for abdominal pain and vomiting.  Genitourinary: Negative for dysuria and hematuria.  Musculoskeletal: Negative for arthralgias and back pain.  Skin: Negative for color change and rash.  Neurological: Negative for seizures and syncope.  All other systems reviewed and are negative.    Physical Exam Triage Vital Signs ED Triage Vitals  Enc Vitals Group     BP 08/24/18 1734 (!) 166/80     Pulse Rate 08/24/18 1734 89     Resp 08/24/18 1734 18     Temp 08/24/18 1734 98 F (36.7 C)     Temp src --      SpO2 08/24/18 1734 100 %     Weight --      Height --      Head Circumference --  Peak Flow --      Pain Score 08/24/18 1736 2     Pain Loc --      Pain Edu? --      Excl. in GC? --    No data found.  Updated Vital Signs BP (!) 166/80   Pulse 89   Temp 98 F (36.7 C)   Resp 18   SpO2 100%      Physical Exam Constitutional:      General: She is not in acute distress.    Appearance: She is well-developed. She is ill-appearing.     Comments: Appears tired.  Very frequent harsh cough  HENT:     Head: Normocephalic and atraumatic.     Right Ear: Tympanic membrane and ear canal normal.     Left Ear: Tympanic membrane and ear canal normal.     Nose: Nose normal. No congestion.     Mouth/Throat:     Mouth: Mucous membranes are moist.  Eyes:     Conjunctiva/sclera: Conjunctivae normal.     Pupils: Pupils are equal,  round, and reactive to light.  Neck:     Musculoskeletal: Normal range of motion.  Cardiovascular:     Rate and Rhythm: Normal rate and regular rhythm.  Pulmonary:     Effort: Pulmonary effort is normal. No respiratory distress.     Comments: Patient has wheezing throughout.  Restricted air movement.  After DuoNeb treatment, has improvement in her wheezing but they are still prominent.  No rhonchi or rales Abdominal:     General: There is no distension.     Palpations: Abdomen is soft.  Musculoskeletal: Normal range of motion.  Lymphadenopathy:     Cervical: No cervical adenopathy.  Skin:    General: Skin is warm and dry.  Neurological:     General: No focal deficit present.     Mental Status: She is alert.  Psychiatric:        Mood and Affect: Mood normal.        Thought Content: Thought content normal.      UC Treatments / Results  Labs (all labs ordered are listed, but only abnormal results are displayed) Labs Reviewed - No data to display  EKG None  Radiology Dg Chest 2 View  Result Date: 08/24/2018 CLINICAL DATA:  Cough, fever, and chest pain for 2 weeks. EXAM: CHEST - 2 VIEW COMPARISON:  04/06/2011 FINDINGS: The heart size and mediastinal contours are within normal limits. Both lungs are clear. The visualized skeletal structures are unremarkable. IMPRESSION: No active cardiopulmonary disease. Electronically Signed   By: Myles RosenthalJohn  Stahl M.D.   On: 08/24/2018 18:17    Procedures Procedures (including critical care time)  Medications Ordered in UC Medications  ipratropium-albuterol (DUONEB) 0.5-2.5 (3) MG/3ML nebulizer solution 3 mL (3 mLs Nebulization Given 08/24/18 1746)  dexamethasone (DECADRON) injection 10 mg (10 mg Intramuscular Given 08/24/18 1814)    Initial Impression / Assessment and Plan / UC Course  I have reviewed the triage vital signs and the nursing notes.  Pertinent labs & imaging results that were available during my care of the patient were reviewed  by me and considered in my medical decision making (see chart for details).    Chest x-ray is negative.  [facility] not [facility].  Gave her a shot of Decadron in the office and prednisone to take twice a day for 5 days.  She should continue her albuterol inhaler.  Push  fluids.  Use a humidifier.  Rest.  I think she should return if not better in 5 to 7 days.  She is encouraged to establish with a PCP. Final Clinical Impressions(s) / UC Diagnoses   Final diagnoses:  Persistent cough  Wheezes     Discharge Instructions     The cough medicine will be "cheratussin" It has a small amount of codeine so caution drowsiness Take the prednisone 2 x a day for 5 days Continue albuterol as directed Push fluids Use humidifier if you have one Follow up if no improvement    ED Prescriptions    Medication Sig Dispense Auth. Provider   predniSONE (DELTASONE) 20 MG tablet Take 1 tablet (20 mg total) by mouth 2 (two) times daily with a meal. 10 tablet Eustace MooreNelson, Caedyn Tassinari Sue, MD   chlorpheniramine-HYDROcodone [facility](TUSSIONEX PENNKINETIC ER) 10-8 MG/5ML SUER  (Status: Discontinued) Take 5 mLs by mouth every 12 (twelve) hours as needed for cough. 240 mL Eustace MooreNelson, Lanelle Lindo Sue, MD    Cheratussin syrup called in instead due to insurance coverage  Controlled Substance Prescriptions [place] Controlled Substance Registry consulted? Yes, I have consulted the [place] Controlled Substances Registry for this patient, and feel the risk/benefit ratio today is favorable for proceeding with this prescription for a controlled substance.   Eustace MooreNelson, Jasara Corrigan Sue, MD 08/24/18 [id number]

## 2019-01-25 ENCOUNTER — Other Ambulatory Visit (HOSPITAL_COMMUNITY)
Admission: RE | Admit: 2019-01-25 | Discharge: 2019-01-25 | Disposition: A | Discharge status: Home / Self Care | Payer: 59 | Source: Ambulatory Visit | Attending: Family Medicine | Admitting: Family Medicine

## 2019-01-25 ENCOUNTER — Other Ambulatory Visit: Payer: Self-pay | Admitting: Family Medicine

## 2019-01-25 DIAGNOSIS — Z0000 Encounter for general adult medical examination without abnormal findings: Secondary | ICD-10-CM | POA: Insufficient documentation

## 2019-01-27 LAB — CYTOLOGY - PAP: Diagnosis: NEGATIVE

## 2019-07-11 ENCOUNTER — Other Ambulatory Visit (HOSPITAL_COMMUNITY): Payer: Self-pay | Admitting: Gastroenterology

## 2019-07-11 ENCOUNTER — Other Ambulatory Visit: Payer: Self-pay | Admitting: Gastroenterology

## 2019-07-11 DIAGNOSIS — R1011 Right upper quadrant pain: Secondary | ICD-10-CM

## 2019-07-21 ENCOUNTER — Other Ambulatory Visit: Payer: Self-pay

## 2019-07-21 ENCOUNTER — Ambulatory Visit (HOSPITAL_COMMUNITY)
Admission: RE | Admit: 2019-07-21 | Discharge: 2019-07-21 | Disposition: A | Discharge status: Home / Self Care | Payer: 59 | Source: Ambulatory Visit | Attending: Gastroenterology | Admitting: Gastroenterology

## 2019-07-21 DIAGNOSIS — R1011 Right upper quadrant pain: Secondary | ICD-10-CM | POA: Insufficient documentation

## 2019-07-21 MED ORDER — TECHNETIUM TC 99M MEBROFENIN IV KIT
5.3000 | PACK | Freq: Once | INTRAVENOUS | Status: AC | PRN
  Administered 2019-07-21: 13:00:00 5.3 via INTRAVENOUS

## 2019-07-24 ENCOUNTER — Other Ambulatory Visit: Payer: Self-pay | Admitting: Gastroenterology

## 2019-07-24 DIAGNOSIS — R7989 Other specified abnormal findings of blood chemistry: Secondary | ICD-10-CM

## 2019-07-24 DIAGNOSIS — R110 Nausea: Secondary | ICD-10-CM

## 2019-07-24 DIAGNOSIS — R1033 Periumbilical pain: Secondary | ICD-10-CM

## 2019-08-11 ENCOUNTER — Ambulatory Visit
Admission: RE | Admit: 2019-08-11 | Discharge: 2019-08-11 | Disposition: A | Discharge status: Home / Self Care | Payer: 59 | Source: Ambulatory Visit | Attending: Gastroenterology | Admitting: Gastroenterology

## 2019-08-11 DIAGNOSIS — R1033 Periumbilical pain: Secondary | ICD-10-CM

## 2019-08-11 DIAGNOSIS — R110 Nausea: Secondary | ICD-10-CM

## 2019-08-11 DIAGNOSIS — R7989 Other specified abnormal findings of blood chemistry: Secondary | ICD-10-CM

## 2019-08-11 MED ORDER — IOPAMIDOL (ISOVUE-300) INJECTION 61%
100.0000 mL | Freq: Once | INTRAVENOUS | Status: AC | PRN
  Administered 2019-08-11: 14:00:00 100 mL via INTRAVENOUS

## 2019-08-25 ENCOUNTER — Other Ambulatory Visit: Payer: Self-pay | Admitting: Surgery

## 2019-09-11 ENCOUNTER — Other Ambulatory Visit: Payer: Self-pay | Admitting: Surgery

## 2020-01-26 ENCOUNTER — Ambulatory Visit
Admission: RE | Admit: 2020-01-26 | Discharge: 2020-01-26 | Disposition: A | Discharge status: Home / Self Care | Payer: 59 | Source: Ambulatory Visit | Attending: Family Medicine | Admitting: Family Medicine

## 2020-01-26 ENCOUNTER — Other Ambulatory Visit: Payer: Self-pay | Admitting: Family Medicine

## 2020-01-26 ENCOUNTER — Other Ambulatory Visit: Payer: Self-pay

## 2020-01-26 DIAGNOSIS — R062 Wheezing: Secondary | ICD-10-CM

## 2020-04-08 ENCOUNTER — Other Ambulatory Visit: Payer: Self-pay

## 2020-04-08 ENCOUNTER — Encounter: Payer: Self-pay | Admitting: Emergency Medicine

## 2020-04-08 ENCOUNTER — Ambulatory Visit (INDEPENDENT_AMBULATORY_CARE_PROVIDER_SITE_OTHER): Payer: 59

## 2020-04-08 ENCOUNTER — Ambulatory Visit
Admission: EM | Admit: 2020-04-08 | Discharge: 2020-04-08 | Disposition: A | Discharge status: Home / Self Care | Payer: 59 | Attending: Family Medicine | Admitting: Family Medicine

## 2020-04-08 DIAGNOSIS — Z20822 Contact with and (suspected) exposure to covid-19: Secondary | ICD-10-CM | POA: Diagnosis not present

## 2020-04-08 DIAGNOSIS — J209 Acute bronchitis, unspecified: Secondary | ICD-10-CM

## 2020-04-08 DIAGNOSIS — Z87891 Personal history of nicotine dependence: Secondary | ICD-10-CM

## 2020-04-08 DIAGNOSIS — R5383 Other fatigue: Secondary | ICD-10-CM

## 2020-04-08 DIAGNOSIS — R062 Wheezing: Secondary | ICD-10-CM | POA: Diagnosis not present

## 2020-04-08 DIAGNOSIS — B349 Viral infection, unspecified: Secondary | ICD-10-CM

## 2020-04-08 HISTORY — DX: Edema, unspecified: R60.9

## 2020-04-08 LAB — POCT URINALYSIS DIP (MANUAL ENTRY)
Bilirubin, UA: NEGATIVE
Blood, UA: NEGATIVE
Glucose, UA: NEGATIVE mg/dL
Ketones, POC UA: NEGATIVE mg/dL
Leukocytes, UA: NEGATIVE
Nitrite, UA: NEGATIVE
Protein Ur, POC: NEGATIVE mg/dL
Spec Grav, UA: 1.025 (ref 1.010–1.025)
Urobilinogen, UA: 0.2 E.U./dL
pH, UA: 8 (ref 5.0–8.0)

## 2020-04-08 MED ORDER — PREDNISONE 20 MG PO TABS: 40.0000 mg | ORAL_TABLET | Freq: Every day | ORAL | 0 refills | Status: AC

## 2020-04-08 MED ORDER — BENZONATATE 100 MG PO CAPS: 100.0000 mg | ORAL_CAPSULE | Freq: Three times a day (TID) | ORAL | 0 refills | Status: DC | PRN

## 2020-04-08 NOTE — Discharge Instructions (Addendum)
Chest x-ray is negative for any concerning or abnormal findings.   Continue use of albuterol inhaler 2 puffs every 4-6 hours as needed for shortness of breath or wheezing. Start prednisone 40 mg with food on tomorrow and take daily for total of 5 days to resolve any underlying inflammation in the lungs. Recommend hydrating well with water.   Recommendation management of viral illness include:Vitamin D 5,000 IU daily, Vitamin C 500 mg twice daily Zinc 50 mg daily

## 2020-04-08 NOTE — ED Triage Notes (Signed)
04/03/2020 started feeling bad.  The next day got a covid test and it was negative.  Had another test day before yesterday and it was negative.   Nausea, headache, tingling hands and arms ( intermittent) intermittently feels like in a fog, fatigue.  Slight cough, slight runny nose.  Yesterday temp was 99.6.

## 2020-04-08 NOTE — ED Provider Notes (Signed)
[facility]    CSN: [id number] Arrival date & time: 04/08/20  1254      History   Chief Complaint Chief Complaint  Patient presents with  . Headache    HPI Brenda Robinson is a 59 y.o. female.   HPI Patient presents today with symptoms including severe fatigue, fogginess, mild cough, headache and wheezing with sinus congestion and nausea x3 days.  Patient had a negative COVID-19 test performed 2 days ago at a drive-through site.  She has had a mild low-grade temperature and is 99.6 today.  No known exposure to COVID-19.  She has some history of intermittent airway disorder and has been treated with albuterol although has no official diagnosis of asthma.  She also suffers from chronic allergies.  Denies any previous history of pneumonia.  Shortness of breath is intermittent and not persistent. Past Medical History:  Diagnosis Date  . Fluid retention     There are no problems to display for this patient.   Past Surgical History:  Procedure Laterality Date  . BACK SURGERY    . CHOLECYSTECTOMY    . HERNIA REPAIR      OB History   No obstetric history on file.      Home Medications    Prior to Admission medications   Medication Sig Start Date End Date Taking? Authorizing Provider  FLUoxetine (PROZAC) 10 MG tablet Take 10 mg by mouth daily.   Yes [provider]  albuterol (PROVENTIL HFA;VENTOLIN HFA) 108 (90 Base) MCG/ACT inhaler Inhale 1-2 puffs into the lungs every 6 (six) hours as needed for wheezing or shortness of breath. 08/15/18   Eustace Moore, MD  predniSONE (DELTASONE) 20 MG tablet Take 1 tablet (20 mg total) by mouth 2 (two) times daily with a meal. 08/24/18   Eustace Moore, MD  triamterene-hydrochlorothiazide (MAXZIDE-25) 37.5-25 MG tablet Take 1 tablet by mouth daily.    [provider]    Family History Family History  Problem Relation Age of Onset  . Dementia Mother   . Hypertension Mother     Social  History Social History   Tobacco Use  . Smoking status: Never Smoker  . Smokeless tobacco: Never Used  Substance Use Topics  . Alcohol use: Yes  . Drug use: Never     Allergies   Wellbutrin [bupropion]   Review of Systems Review of Systems Pertinent negatives listed in HPI   Physical Exam Triage Vital Signs ED Triage Vitals  Enc Vitals Group     BP 04/08/20 1513 135/85     Pulse Rate 04/08/20 1513 81     Resp 04/08/20 1513 20     Temp 04/08/20 1513 98.2 F (36.8 C)     Temp Source 04/08/20 1513 Oral     SpO2 04/08/20 1513 96 %     Weight --      Height --      Head Circumference --      Peak Flow --      Pain Score 04/08/20 1502 0     Pain Loc --      Pain Edu? --      Excl. in GC? --    No data found.  Updated Vital Signs BP 135/85 (BP Location: Left Arm)   Pulse 81   Temp 98.2 F (36.8 C) (Oral)   Resp 20   SpO2 96%   Visual Acuity Right Eye Distance:   Left Eye Distance:   Bilateral Distance:  Right Eye Near:   Left Eye Near:    Bilateral Near:     Physical Exam General appearance: alert, well developed, cooperative Head: Normocephalic, without obvious abnormality, atraumatic ENT: nasal congestion present Respiratory: Respirations even and unlabored, normal respiratory rate Heart: rate and rhythm normal. No gallop or murmurs noted on exam  Abdomen: BS +, no distention, no rebound tenderness, or no mass Extremities: No edema Skin: Skin color, texture, turgor normal. No rashes seen  Psych: Appropriate mood and affect. Neurologic: GCS 15, symmetrical movements  UC Treatments / Results  Labs (all labs ordered are listed, but only abnormal results are displayed) Labs Reviewed - No data to display  EKG   Radiology No results found.  Procedures Procedures (including critical care time)  Medications Ordered in UC Medications - No data to display  Initial Impression / Assessment and Plan / UC Course  I have reviewed the triage  vital signs and the nursing notes.  Pertinent labs & imaging results that were available during my care of the patient were reviewed by me and considered in my medical decision making (see chart for details).    COVID-19 test pending. Urine collected -UA normal.  Chest x-ray unremarkable. If COVID test is negative and symptoms persist, follow-up with PCP. If breathing or chest pain or pressure develops, go to the ER. Final Clinical Impressions(s) / UC Diagnoses   Final diagnoses:  Suspected COVID-19 virus infection  Acute bronchitis, unspecified organism  Acute viral syndrome     Discharge Instructions     Chest x-ray is negative for any concerning or abnormal findings.   Continue use of albuterol inhaler 2 puffs every 4-6 hours as needed for shortness of breath or wheezing. Start prednisone 40 mg with food on tomorrow and take daily for total of 5 days to resolve any underlying inflammation in the lungs. Recommend hydrating well with water.   Recommendation management of viral illness include:Vitamin D 5,000 IU daily, Vitamin C 500 mg twice daily Zinc 50 mg daily     ED Prescriptions    Medication Sig Dispense Auth. Provider   predniSONE (DELTASONE) 20 MG tablet Take 2 tablets (40 mg total) by mouth daily for 5 days. 10 tablet Bing Neighbors, FNP   benzonatate (TESSALON) 100 MG capsule Take 1-2 capsules (100-200 mg total) by mouth 3 (three) times daily as needed for cough. 40 capsule Bing Neighbors, FNP     PDMP not reviewed this encounter.   Bing Neighbors, [place] 04/13/20 [phone]

## 2020-04-10 LAB — SARS-COV-2, NAA 2 DAY TAT

## 2020-04-10 LAB — NOVEL CORONAVIRUS, NAA: SARS-CoV-2, NAA: NOT DETECTED

## 2020-07-28 ENCOUNTER — Other Ambulatory Visit: Payer: Self-pay

## 2020-07-28 ENCOUNTER — Ambulatory Visit
Admission: EM | Admit: 2020-07-28 | Discharge: 2020-07-28 | Disposition: A | Discharge status: Home / Self Care | Payer: 59 | Attending: Emergency Medicine | Admitting: Emergency Medicine

## 2020-07-28 ENCOUNTER — Ambulatory Visit (INDEPENDENT_AMBULATORY_CARE_PROVIDER_SITE_OTHER): Payer: 59

## 2020-07-28 DIAGNOSIS — R062 Wheezing: Secondary | ICD-10-CM | POA: Diagnosis not present

## 2020-07-28 DIAGNOSIS — R059 Cough, unspecified: Secondary | ICD-10-CM | POA: Diagnosis not present

## 2020-07-28 DIAGNOSIS — J209 Acute bronchitis, unspecified: Secondary | ICD-10-CM | POA: Diagnosis not present

## 2020-07-28 MED ORDER — DEXAMETHASONE SODIUM PHOSPHATE 10 MG/ML IJ SOLN
10.0000 mg | Freq: Once | INTRAMUSCULAR | Status: AC
  Administered 2020-07-28: 13:00:00 10 mg via INTRAVENOUS

## 2020-07-28 MED ORDER — BENZONATATE 200 MG PO CAPS: 200.0000 mg | ORAL_CAPSULE | Freq: Three times a day (TID) | ORAL | 0 refills | Status: AC | PRN

## 2020-07-28 MED ORDER — ALBUTEROL SULFATE HFA 108 (90 BASE) MCG/ACT IN AERS
2.0000 | INHALATION_SPRAY | Freq: Once | RESPIRATORY_TRACT | Status: AC
  Administered 2020-07-28: 13:00:00 2 via RESPIRATORY_TRACT

## 2020-07-28 MED ORDER — DM-GUAIFENESIN ER 30-600 MG PO TB12: 1.0000 | ORAL_TABLET | Freq: Two times a day (BID) | ORAL | 0 refills | Status: AC

## 2020-07-28 MED ORDER — PREDNISONE 50 MG PO TABS: 50.0000 mg | ORAL_TABLET | Freq: Every day | ORAL | 0 refills | Status: AC

## 2020-07-28 NOTE — ED Triage Notes (Signed)
Patient states she has had a cough, congestion, nasal drainage. Pt is aox4 and ambulatory.

## 2020-07-28 NOTE — Discharge Instructions (Signed)
Please use albuterol inhaler 1 to 2 puffs every 4-6 hours as needed for shortness of breath chest tightness and wheezing We gave you a dose of Decadron here, continue with prednisone 50 mg daily for 5 days-take with food and in the morning if you are able Tessalon for cough Supple with Mucinex DM-may get over-the-counter if cheaper Follow-up if not improving or worsening

## 2020-07-28 NOTE — ED Provider Notes (Signed)
[facility]    CSN: [id number] Arrival date & time: 07/28/20  0932      History   Chief Complaint Chief Complaint  Patient presents with  . Cough    Since wednesday    HPI Brenda Robinson is a 59 y.o. female presenting today for evaluation of URI symptoms.  Patient reports cough sore throat and headache.  Reports symptoms have been going on for approximately 5 to 6 days.  Reports a lot of wheezing and chest tightness similar to past bronchitis.  Has done well with steroids in the past.  Denies history of underlying lung problems.  HPI  Past Medical History:  Diagnosis Date  . Fluid retention     There are no problems to display for this patient.   Past Surgical History:  Procedure Laterality Date  . BACK SURGERY    . CHOLECYSTECTOMY    . HERNIA REPAIR      OB History   No obstetric history on file.      Home Medications    Prior to Admission medications   Medication Sig Start Date End Date Taking? Authorizing Provider  albuterol (PROVENTIL HFA;VENTOLIN HFA) 108 (90 Base) MCG/ACT inhaler Inhale 1-2 puffs into the lungs every 6 (six) hours as needed for wheezing or shortness of breath. 08/15/18  Yes Eustace Moore, MD  triamterene-hydrochlorothiazide (MAXZIDE-25) 37.5-25 MG tablet Take 1 tablet by mouth daily.   Yes [provider]  benzonatate (TESSALON) 200 MG capsule Take 1 capsule (200 mg total) by mouth 3 (three) times daily as needed for up to 7 days for cough. 07/28/20 08/04/20  Janashia Parco C, PA-C  dextromethorphan-guaiFENesin (MUCINEX DM) 30-600 MG 12hr tablet Take 1 tablet by mouth 2 (two) times daily. 07/28/20   Mitesh Rosendahl C, PA-C  predniSONE (DELTASONE) 50 MG tablet Take 1 tablet (50 mg total) by mouth daily with breakfast for 5 days. 07/28/20 08/02/20  Odelle Kosier C, PA-C  FLUoxetine (PROZAC) 10 MG tablet Take 10 mg by mouth daily.  07/28/20  [provider]    Family History Family History  Problem  Relation Age of Onset  . Dementia Mother   . Hypertension Mother     Social History Social History   Tobacco Use  . Smoking status: Never Smoker  . Smokeless tobacco: Never Used  Vaping Use  . Vaping Use: Never used  Substance Use Topics  . Alcohol use: Yes  . Drug use: Never     Allergies   Wellbutrin [bupropion]   Review of Systems Review of Systems  Constitutional: Negative for activity change, appetite change, chills, fatigue and fever.  HENT: Positive for congestion, rhinorrhea and sinus pressure. Negative for ear pain, sore throat and trouble swallowing.   Eyes: Negative for discharge and redness.  Respiratory: Positive for cough, chest tightness and wheezing. Negative for shortness of breath.   Cardiovascular: Negative for chest pain.  Gastrointestinal: Negative for abdominal pain, diarrhea, nausea and vomiting.  Musculoskeletal: Negative for myalgias.  Skin: Negative for rash.  Neurological: Negative for dizziness, light-headedness and headaches.     Physical Exam Triage Vital Signs ED Triage Vitals  Enc Vitals Group     BP      Pulse      Resp      Temp      Temp src      SpO2      Weight      Height      Head Circumference  Peak Flow      Pain Score      Pain Loc      Pain Edu?      Excl. in GC?    No data found.  Updated Vital Signs BP (!) 151/86 (BP Location: Left Arm)   Pulse 83   Temp 98.2 F (36.8 C) (Oral)   Resp 16   SpO2 94%   Visual Acuity Right Eye Distance:   Left Eye Distance:   Bilateral Distance:    Right Eye Near:   Left Eye Near:    Bilateral Near:     Physical Exam Vitals and nursing note reviewed.  Constitutional:      Appearance: She is well-developed and well-nourished.     Comments: No acute distress  HENT:     Head: Normocephalic and atraumatic.     Ears:     Comments: Bilateral ears without tenderness to palpation of external auricle, tragus and mastoid, EAC's without erythema or swelling, TM's  with good bony landmarks and cone of light. Non erythematous.     Nose: Nose normal.     Mouth/Throat:     Comments: Oral mucosa pink and moist, no tonsillar enlargement or exudate. Posterior pharynx patent and nonerythematous, no uvula deviation or swelling. Normal phonation. Eyes:     Conjunctiva/sclera: Conjunctivae normal.  Cardiovascular:     Rate and Rhythm: Normal rate.  Pulmonary:     Effort: Pulmonary effort is normal. No respiratory distress.     Comments: Breathing comfortably at rest, inspiratory and expiratory wheezing throughout bilateral lung fields Abdominal:     General: There is no distension.  Musculoskeletal:        General: Normal range of motion.     Cervical back: Neck supple.  Skin:    General: Skin is warm and dry.  Neurological:     Mental Status: She is alert and oriented to person, place, and time.  Psychiatric:        Mood and Affect: Mood and affect normal.      UC Treatments / Results  Labs (all labs ordered are listed, but only abnormal results are displayed) Labs Reviewed - No data to display  EKG   Radiology DG Chest 2 View  Result Date: 07/28/2020 CLINICAL DATA:  Cough and congestion with wheezing.  Nonsmoker. EXAM: CHEST - 2 VIEW COMPARISON:  04/08/2020 FINDINGS: Midline trachea. Normal heart size and mediastinal contours. No pleural effusion or pneumothorax. Minimal subsegmental atelectasis at the left lung base laterally. IMPRESSION: No acute cardiopulmonary disease. Electronically Signed   By: Jeronimo Greaves M.D.   On: 07/28/2020 12:46    Procedures Procedures (including critical care time)  Medications Ordered in UC Medications  dexamethasone (DECADRON) injection 10 mg (has no administration in time range)  albuterol (VENTOLIN HFA) 108 (90 Base) MCG/ACT inhaler 2 puff (has no administration in time range)    Initial Impression / Assessment and Plan / UC Course  I have reviewed the triage vital signs and the nursing  notes.  Pertinent labs & imaging results that were available during my care of the patient were reviewed by me and considered in my medical decision making (see chart for details).     Chest x-ray negative for pneumonia, treating for bronchitis.  O2 improved to 96% without mask on.  Providing Decadron prior to discharge along with albuterol inhaler.  Continuing on prednisone and symptomatic and supportive care of cough and congestion.  Symptoms x4 to 5 days, deferring antibiotic therapy  at this time, most likely viral.  Discussed strict return precautions. Patient verbalized understanding and is agreeable with plan.  Final Clinical Impressions(s) / UC Diagnoses   Final diagnoses:  Acute bronchitis, unspecified organism     Discharge Instructions     Please use albuterol inhaler 1 to 2 puffs every 4-6 hours as needed for shortness of breath chest tightness and wheezing We gave you a dose of Decadron here, continue with prednisone 50 mg daily for 5 days-take with food and in the morning if you are able Tessalon for cough Supple with Mucinex DM-may get over-the-counter if cheaper Follow-up if not improving or worsening    ED Prescriptions    Medication Sig Dispense Auth. Provider   predniSONE (DELTASONE) 50 MG tablet Take 1 tablet (50 mg total) by mouth daily with breakfast for 5 days. 5 tablet Giada Schoppe C, PA-C   benzonatate (TESSALON) 200 MG capsule Take 1 capsule (200 mg total) by mouth 3 (three) times daily as needed for up to 7 days for cough. 28 capsule Damari Hiltz C, PA-C   dextromethorphan-guaiFENesin (MUCINEX DM) 30-600 MG 12hr tablet Take 1 tablet by mouth 2 (two) times daily. 20 tablet Rachit Grim, [place] C, PA-C     PDMP not reviewed this encounter.   Lew Dawes, [place] 07/28/20 1257

## 2020-08-05 ENCOUNTER — Telehealth: Payer: Self-pay | Admitting: Emergency Medicine

## 2020-08-05 MED ORDER — DOXYCYCLINE HYCLATE 100 MG PO CAPS: 100.0000 mg | ORAL_CAPSULE | Freq: Two times a day (BID) | ORAL | 0 refills | Status: DC

## 2020-08-05 MED ORDER — DOXYCYCLINE HYCLATE 100 MG PO CAPS: 100.0000 mg | ORAL_CAPSULE | Freq: Two times a day (BID) | ORAL | 0 refills | Status: AC

## 2020-08-05 NOTE — Telephone Encounter (Signed)
Sending doxy for bronchitis, symptoms > 1 week, discussed initially at visit but opted to defer and hold off on antiboitics temporarily

## 2020-08-05 NOTE — Telephone Encounter (Signed)
Pharmacy change

## 2021-02-22 ENCOUNTER — Other Ambulatory Visit: Payer: Self-pay

## 2021-02-22 ENCOUNTER — Encounter: Payer: Self-pay | Admitting: Emergency Medicine

## 2021-02-22 ENCOUNTER — Ambulatory Visit
Admission: EM | Admit: 2021-02-22 | Discharge: 2021-02-22 | Disposition: A | Discharge status: Home / Self Care | Payer: 59 | Attending: Physician Assistant | Admitting: Physician Assistant

## 2021-02-22 DIAGNOSIS — J209 Acute bronchitis, unspecified: Secondary | ICD-10-CM | POA: Diagnosis not present

## 2021-02-22 MED ORDER — DEXAMETHASONE SODIUM PHOSPHATE 10 MG/ML IJ SOLN
10.0000 mg | Freq: Once | INTRAMUSCULAR | Status: AC
  Administered 2021-02-22: 10 mg via INTRAMUSCULAR

## 2021-02-22 NOTE — ED Triage Notes (Signed)
Pt sts cough and congestion that she  thinks is from bronchitis with hx of same; pt sts sx x 3 days

## 2021-02-22 NOTE — ED Provider Notes (Signed)
[facility]    CSN: [id number] Arrival date & time: 02/22/21  0801      History   Chief Complaint Chief Complaint  Patient presents with   Cough    HPI Brenda Robinson is a 60 y.o. female.   Pt complains about cough and congestion Pt has a history of bronchitis.  Pt request a steroid injection  The history is provided by the patient. No language interpreter was used.  Cough Cough characteristics:  Productive Sputum characteristics:  Green Severity:  Moderate Onset quality:  Gradual Timing:  Constant Chronicity:  New Relieved by:  Nothing Worsened by:  Nothing Ineffective treatments:  None tried Associated symptoms: no fever and no shortness of breath   Risk factors: no recent infection    Past Medical History:  Diagnosis Date   Allergic rhinitis    Anxiety    Aortic atherosclerosis (HCC)    COVID    DM (diabetes mellitus) (HCC)    Ear ringing    Edema    Elevated LDL cholesterol level    Fluid retention    GERD (gastroesophageal reflux disease)    Hepatic steatosis    Hyperlipidemia    Hypertension    Morbid obesity (HCC)    Vitamin D deficiency     There are no problems to display for this patient.   Past Surgical History:  Procedure Laterality Date   BACK SURGERY     CHOLECYSTECTOMY     HERNIA REPAIR     PARTIAL HYSTERECTOMY      OB History   No obstetric history on file.      Home Medications    Prior to Admission medications   Medication Sig Start Date End Date Taking? Authorizing Provider  albuterol (PROVENTIL HFA;VENTOLIN HFA) 108 (90 Base) MCG/ACT inhaler Inhale 1-2 puffs into the lungs every 6 (six) hours as needed for wheezing or shortness of breath. 08/15/18   Eustace Moore, MD  Ascorbic Acid (VITAMIN C) 100 MG tablet Take 100 mg by mouth daily.    [provider]  atorvastatin (LIPITOR) 20 MG tablet Take 20 mg by mouth daily.    [provider]  Cholecalciferol (VITAMIN D) 50 MCG (2000 UT) CAPS  Take by mouth.    [provider]  dextromethorphan-guaiFENesin (MUCINEX DM) 30-600 MG 12hr tablet Take 1 tablet by mouth 2 (two) times daily. 07/28/20   Wieters, Hallie C, PA-C  doxycycline (VIBRAMYCIN) 100 MG capsule Take 1 capsule (100 mg total) by mouth 2 (two) times daily. 08/05/20   Wieters, Hallie C, PA-C  escitalopram (LEXAPRO) 10 MG tablet Take 10 mg by mouth daily.    [provider]  fluticasone (FLONASE) 50 MCG/ACT nasal spray Place into both nostrils daily.    [provider]  loratadine (CLARITIN) 10 MG tablet Take 10 mg by mouth daily.    [provider]  triamterene-hydrochlorothiazide (MAXZIDE-25) 37.5-25 MG tablet Take 1 tablet by mouth daily.    [provider]  FLUoxetine (PROZAC) 10 MG tablet Take 10 mg by mouth daily.  07/28/20  [provider]    Family History Family History  Problem Relation Age of Onset   Heart attack Mother        cabg   Dementia Mother    Hypertension Mother    Coronary artery disease Mother    Hypertension Sister    Coronary artery disease Maternal Grandmother    Hypertension Maternal Grandmother    Heart attack Maternal Grandmother  Coronary artery disease Maternal Grandfather    Heart attack Maternal Grandfather    Heart attack Paternal Uncle    Heart attack Paternal Uncle     Social History Social History   Tobacco Use   Smoking status: Former    Types: Cigarettes   Smokeless tobacco: Never  Vaping Use   Vaping Use: Never used  Substance Use Topics   Alcohol use: Yes   Drug use: Never     Allergies   Prednisone, Pristiq [desvenlafaxine], Rosuvastatin, Simvastatin, and Wellbutrin [bupropion]   Review of Systems Review of Systems  Constitutional:  Negative for fever.  Respiratory:  Positive for cough. Negative for shortness of breath.   All other systems reviewed and are negative.   Physical Exam Triage Vital Signs ED Triage Vitals [02/22/21 0821]  Enc Vitals  Group     BP 118/81     Pulse Rate 83     Resp 20     Temp 98.1 F (36.7 C)     Temp Source Oral     SpO2 96 %     Weight      Height      Head Circumference      Peak Flow      Pain Score      Pain Loc      Pain Edu?      Excl. in GC?    No data found.  Updated Vital Signs BP 118/81 (BP Location: Left Arm)   Pulse 83   Temp 98.1 F (36.7 C) (Oral)   Resp 20   SpO2 96%   Visual Acuity Right Eye Distance:   Left Eye Distance:   Bilateral Distance:    Right Eye Near:   Left Eye Near:    Bilateral Near:     Physical Exam Vitals and nursing note reviewed.  Constitutional:      Appearance: She is well-developed.  HENT:     Head: Normocephalic.  Cardiovascular:     Rate and Rhythm: Normal rate.  Pulmonary:     Effort: Pulmonary effort is normal.  Abdominal:     General: There is no distension.  Musculoskeletal:        General: Normal range of motion.     Cervical back: Normal range of motion.  Skin:    General: Skin is warm.  Neurological:     Mental Status: She is alert and oriented to person, place, and time.  Psychiatric:        Mood and Affect: Mood normal.     UC Treatments / Results  Labs (all labs ordered are listed, but only abnormal results are displayed) Labs Reviewed - No data to display  EKG   Radiology No results found.  Procedures Procedures (including critical care time)  Medications Ordered in UC Medications - No data to display  Initial Impression / Assessment and Plan / UC Course  I have reviewed the triage vital signs and the nursing notes.  Pertinent labs & imaging results that were available during my care of the patient were reviewed by me and considered in my medical decision making (see chart for details).     MDM:  Pt given decadron.  Pt advised to return if symptoms persist Final Clinical Impressions(s) / UC Diagnoses   Final diagnoses:  Acute bronchitis, unspecified organism   Discharge Instructions    None    ED Prescriptions   None    PDMP not reviewed this encounter. Marland Kitchenavs   [place],  Lonia Skinner, PA-C 02/22/21 1015

## 2021-02-22 NOTE — Discharge Instructions (Addendum)
Return if any problems.

## 2021-02-23 LAB — NOVEL CORONAVIRUS, NAA: SARS-CoV-2, NAA: NOT DETECTED

## 2021-02-23 LAB — SARS-COV-2, NAA 2 DAY TAT

## 2021-03-27 NOTE — Progress Notes (Signed)
Cardiology Office Note:    Date:  03/28/2021   ID:  Brenda Robinson, DOB 1961/04/23, MRN [id number]  PCP:  Wilfrid Lund, PA  Cardiologist:  None   Referring MD: Johny Blamer, MD   No chief complaint on file.   History of Present Illness:    Brenda Robinson is a 60 y.o. female with a hx of hyperlipidemia, aortic atherosclerosis, obesity, primary hypertension, diabetes mellitus type II, and sedentary lifestyle.  Here for risk assessment and primary prevention advice.   He is the daughter of a patient.  She is referred by primary care (Dr. Tiburcio Pea) because of aortic atherosclerosis and cardiovascular risk.  She has never had a cardiac event.  She has a prior greater than 20 years smoker having discontinued 15 years ago.  She occasionally gets a subxiphoid vibrating discomfort.  This began after having cholecystectomy.  She has gained some significant weight over the past several months and for the last month or so has noticed some dyspnea on exertion that she attributes to the weight gain.  She furthermore mentions sharp pain between her shoulder blades.  The pain occurs randomly and is not exertion related.  Past Medical History:  Diagnosis Date   Allergic rhinitis    Anxiety    Aortic atherosclerosis (HCC)    COVID    DM (diabetes mellitus) (HCC)    Ear ringing    Edema    Elevated LDL cholesterol level    Fluid retention    GERD (gastroesophageal reflux disease)    Hepatic steatosis    Hyperlipidemia    Hypertension    Morbid obesity (HCC)    Vitamin D deficiency     Past Surgical History:  Procedure Laterality Date   BACK SURGERY     CHOLECYSTECTOMY     HERNIA REPAIR     PARTIAL HYSTERECTOMY      Current Medications: Current Meds  Medication Sig   albuterol (PROVENTIL HFA;VENTOLIN HFA) 108 (90 Base) MCG/ACT inhaler Inhale 1-2 puffs into the lungs every 6 (six) hours as needed for wheezing or shortness of breath.   Ascorbic Acid (VITAMIN C) 100 MG tablet Take  100 mg by mouth daily.   atorvastatin (LIPITOR) 20 MG tablet Take 20 mg by mouth daily.   Cholecalciferol (VITAMIN D) 50 MCG (2000 UT) CAPS Take by mouth.   escitalopram (LEXAPRO) 10 MG tablet Take 10 mg by mouth daily.   fluticasone (FLONASE) 50 MCG/ACT nasal spray Place into both nostrils daily.   loratadine (CLARITIN) 10 MG tablet Take 10 mg by mouth daily.   triamterene-hydrochlorothiazide (MAXZIDE-25) 37.5-25 MG tablet Take 1 tablet by mouth daily.     Allergies:   Prednisone, Pristiq [desvenlafaxine], Rosuvastatin, Simvastatin, and Wellbutrin [bupropion]   Social History   Socioeconomic History   Marital status: Married    Spouse name: Not on file   Number of children: Not on file   Years of education: Not on file   Highest education level: Not on file  Occupational History   Not on file  Tobacco Use   Smoking status: Former    Types: Cigarettes   Smokeless tobacco: Never  Vaping Use   Vaping Use: Never used  Substance and Sexual Activity   Alcohol use: Yes   Drug use: Never   Sexual activity: Not Currently  Other Topics Concern   Not on file  Social History Narrative   Not on file   Social Determinants of Health   Financial Resource Strain: Not  on file  Food Insecurity: Not on file  Transportation Needs: Not on file  Physical Activity: Not on file  Stress: Not on file  Social Connections: Not on file     Family History: The patient's family history includes Coronary artery disease in her maternal grandfather, maternal grandmother, and mother; Dementia in her mother; Heart attack in her maternal grandfather, maternal grandmother, mother, paternal uncle, and paternal uncle; Hypertension in her maternal grandmother, mother, and sister.  ROS:   Please see the history of present illness.    She feels fatigued all the time.  Does not sleep well.  Does not know if she snores significantly.  No history of sleep apnea.  Status post gallbladder surgery all other  systems reviewed and are negative.  EKGs/Labs/Other Studies Reviewed:    The following studies were reviewed today:  CT scan abdomen and pelvis 08/11/2019: IMPRESSION: 1. No acute findings are noted in the abdomen or pelvis to account for the patient's symptoms. 2. Tiny umbilical hernia containing short segments of mid small bowel, without evidence of bowel incarceration or obstruction at this time. 3. Small supraumbilical ventral hernia containing only omental fat. 4. Severe hepatic steatosis. 5. Aortic atherosclerosis.  EKG:  EKG normal sinus rhythm with normal EKG appearance  Recent Labs: No results found for requested labs within last 8760 hours.  Recent Lipid Panel No results found for: CHOL, TRIG, HDL, CHOLHDL, VLDL, LDLCALC, LDLDIRECT  Physical Exam:    VS:  BP 122/88   Pulse 88   Ht 5\' 2"  (1.575 m)   Wt 226 lb (102.5 kg)   SpO2 98%   BMI 41.34 kg/m     Wt Readings from Last 3 Encounters:  03/28/21 226 lb (102.5 kg)  04/14/16 185 lb (83.9 kg)  04/14/16 185 lb (83.9 kg)     GEN: Overweight. No acute distress HEENT: Normal NECK: No JVD. LYMPHATICS: No lymphadenopathy CARDIAC: No murmur. RRR no gallop, or edema. VASCULAR:  Normal Pulses. No bruits. RESPIRATORY:  Clear to auscultation without rales, wheezing or rhonchi  ABDOMEN: Soft, non-tender, non-distended, No pulsatile mass, MUSCULOSKELETAL: No deformity  SKIN: Warm and dry NEUROLOGIC:  Alert and oriented x 3 PSYCHIATRIC:  Normal affect   ASSESSMENT:    1. Aortic atherosclerosis (HCC)   2. Family history of early CAD   3. Hyperlipidemia LDL goal <70   4. Primary hypertension   5. Type 2 diabetes mellitus with complication, without long-term current use of insulin (HCC)    PLAN:    In order of problems listed above:  Primary prevention is recommended.  Please see below.  I recommended coronary calcium score since she is having atypical symptoms that could be ischemic equivalents although I do not  believe that they are. Her mother has coronary artery disease and is status post prior coronary bypass surgery. She is on statin therapy but not a dose sufficient to get the LDL into target range when diabetic.  LDL needs to be less than 70.  For that reason a coronary calcium score will be done to determine if there is significant asymptomatic CAD.  If so, more aggressive lipid-lowering will be recommended. She has all Maxide and has reasonable blood pressure although diastolic is above 80. Currently diabetic based upon the most recent hemoglobin A1c which was 6.8.  We discussed this.  The elevated hemoglobin A1c would mandate an LDL less than 70.  Also need to consider dapagliflozin or empagliflozin as CV risk reduction in her setting.  Overall  education and awareness concerning primary risk prevention was discussed in detail: LDL less than 70, hemoglobin A1c less than 7, blood pressure target less than 130/80 mmHg, >150 minutes of moderate aerobic activity per week, avoidance of smoking, weight control (via diet and exercise), and continued surveillance/management of/for obstructive sleep apnea.  We stressed the aerobic activity.  Medication Adjustments/Labs and Tests Ordered: Current medicines are reviewed at length with the patient today.  Concerns regarding medicines are outlined above.  Orders Placed This Encounter  Procedures   CT CARDIAC SCORING (SELF PAY ONLY)   EKG 12-Lead    No orders of the defined types were placed in this encounter.   Patient Instructions  Medication Instructions:  Your physician recommends that you continue on your current medications as directed. Please refer to the Current Medication list given to you today.  *If you need a refill on your cardiac medications before your next appointment, please call your pharmacy*   Lab Work: None If you have labs (blood work) drawn today and your tests are completely normal, you will receive your results only  by: MyChart Message (if you have MyChart) OR A paper copy in the mail If you have any lab test that is abnormal or we need to change your treatment, we will call you to review the results.   Testing/Procedures: Your physician recommends that you have a Calcium Score performed.   Follow-Up: At [facility], you and your health needs are our priority.  As part of our continuing mission to provide you with exceptional heart care, we have created designated Provider Care Teams.  These Care Teams include your primary Cardiologist (physician) and Advanced Practice Providers (APPs -  Physician Assistants and Nurse Practitioners) who all work together to provide you with the care you need, when you need it.  We recommend signing up for the patient portal called "MyChart".  Sign up information is provided on this After Visit Summary.  MyChart is used to connect with patients for Virtual Visits (Telemedicine).  Patients are able to view lab/test results, encounter notes, upcoming appointments, etc.  Non-urgent messages can be sent to your provider as well.   To learn more about what you can do with MyChart, go to [email or web address].    Your next appointment:   As needed  The format for your next appointment:   In Person  Provider:   You may see Verdis Prime, MD or one of the following Advanced Practice Providers on your designated Care Team:   Nada Boozer, NP   Other Instructions  Your provider recommends that you maintain 150 minutes per week of moderate aerobic activity.     Signed, Lesleigh Noe, MD  03/28/2021 3:12 PM    [facility]

## 2021-03-28 ENCOUNTER — Ambulatory Visit: Payer: 59 | Admitting: Interventional Cardiology

## 2021-03-28 ENCOUNTER — Other Ambulatory Visit: Payer: Self-pay

## 2021-03-28 ENCOUNTER — Encounter: Payer: Self-pay | Admitting: Interventional Cardiology

## 2021-03-28 VITALS — BP 122/88 | HR 88 | Ht 62.0 in | Wt 226.0 lb

## 2021-03-28 DIAGNOSIS — I10 Essential (primary) hypertension: Secondary | ICD-10-CM | POA: Diagnosis not present

## 2021-03-28 DIAGNOSIS — Z8249 Family history of ischemic heart disease and other diseases of the circulatory system: Secondary | ICD-10-CM | POA: Diagnosis not present

## 2021-03-28 DIAGNOSIS — E118 Type 2 diabetes mellitus with unspecified complications: Secondary | ICD-10-CM

## 2021-03-28 DIAGNOSIS — E785 Hyperlipidemia, unspecified: Secondary | ICD-10-CM | POA: Diagnosis not present

## 2021-03-28 DIAGNOSIS — I700 Atherosclerosis of aorta: Secondary | ICD-10-CM | POA: Diagnosis not present

## 2021-03-28 NOTE — Patient Instructions (Signed)
Medication Instructions:  Your physician recommends that you continue on your current medications as directed. Please refer to the Current Medication list given to you today.  *If you need a refill on your cardiac medications before your next appointment, please call your pharmacy*   Lab Work: None If you have labs (blood work) drawn today and your tests are completely normal, you will receive your results only by: MyChart Message (if you have MyChart) OR A paper copy in the mail If you have any lab test that is abnormal or we need to change your treatment, we will call you to review the results.   Testing/Procedures: Your physician recommends that you have a Calcium Score performed.   Follow-Up: At [facility], you and your health needs are our priority.  As part of our continuing mission to provide you with exceptional heart care, we have created designated Provider Care Teams.  These Care Teams include your primary Cardiologist (physician) and Advanced Practice Providers (APPs -  Physician Assistants and Nurse Practitioners) who all work together to provide you with the care you need, when you need it.  We recommend signing up for the patient portal called "MyChart".  Sign up information is provided on this After Visit Summary.  MyChart is used to connect with patients for Virtual Visits (Telemedicine).  Patients are able to view lab/test results, encounter notes, upcoming appointments, etc.  Non-urgent messages can be sent to your provider as well.   To learn more about what you can do with MyChart, go to [email or web address].    Your next appointment:   As needed  The format for your next appointment:   In Person  Provider:   You may see Verdis Prime, MD or one of the following Advanced Practice Providers on your designated Care Team:   Nada Boozer, NP   Other Instructions  Your provider recommends that you maintain 150 minutes per week of moderate aerobic activity.

## 2021-04-11 ENCOUNTER — Ambulatory Visit (INDEPENDENT_AMBULATORY_CARE_PROVIDER_SITE_OTHER)
Admission: RE | Admit: 2021-04-11 | Discharge: 2021-04-11 | Disposition: A | Discharge status: Home / Self Care | Payer: Self-pay | Source: Ambulatory Visit | Attending: Interventional Cardiology | Admitting: Interventional Cardiology

## 2021-04-11 ENCOUNTER — Other Ambulatory Visit: Payer: Self-pay

## 2021-04-11 DIAGNOSIS — Z8249 Family history of ischemic heart disease and other diseases of the circulatory system: Secondary | ICD-10-CM

## 2021-04-11 DIAGNOSIS — I700 Atherosclerosis of aorta: Secondary | ICD-10-CM

## 2021-04-11 DIAGNOSIS — E785 Hyperlipidemia, unspecified: Secondary | ICD-10-CM

## 2021-05-15 ENCOUNTER — Encounter: Payer: Self-pay | Admitting: *Deleted

## 2022-12-11 IMAGING — CT CT CARDIAC CORONARY ARTERY CALCIUM SCORE
3 series · 14 of 20 positions shown, 16 images · non-contrast
Comparison: 07/28/2020 chest radiograph. No prior CT.
COMPARISON: 07/28/2020 chest radiograph. No prior CT.

Addendum:
EXAM:
OVER-READ INTERPRETATION  CT CHEST

The following report is an over-read performed by radiologist Dr.
Sendy Franko [REDACTED] on 04/11/2021. This over-read
does not include interpretation of cardiac or coronary anatomy or
pathology. The calcium score interpretation by the cardiologist is
attached.
CLINICAL DATA: Cardiovascular disease risk stratification
CT Coronary Calcium Score
TECHNIQUE: A gated, non-contrast computed tomography scan of the heart was
performed using 3mm slice thickness. Axial images were analyzed on a
dedicated workstation. Calcium scoring of the coronary arteries was
performed using the Agatston method.

[Series 2: cascseq 2.0 sa36 70% (id) · axial · 0.39mm/px · z∈[-216,-136]mm · 4 of 68 slices shown]
[im 14/68  vessel]
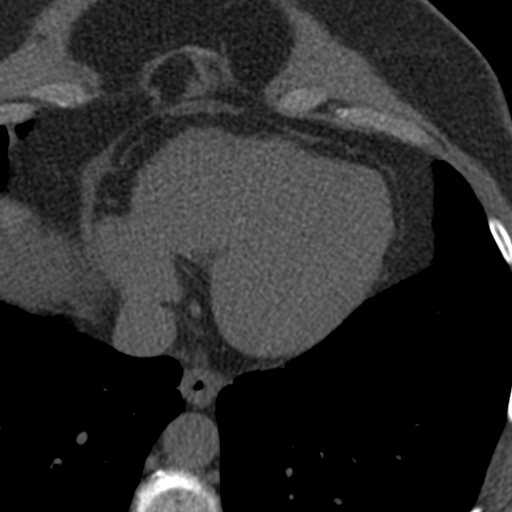
[im 27/68  vessel]
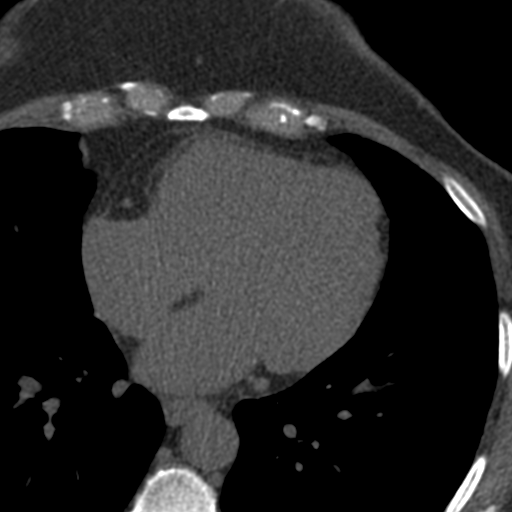
[im 41/68  vessel]
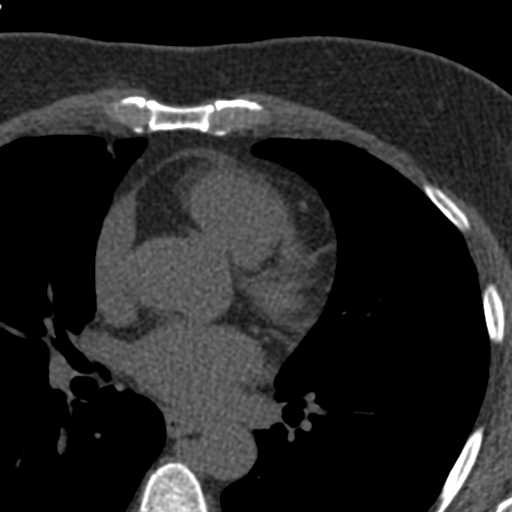
[im 54/68  vessel]
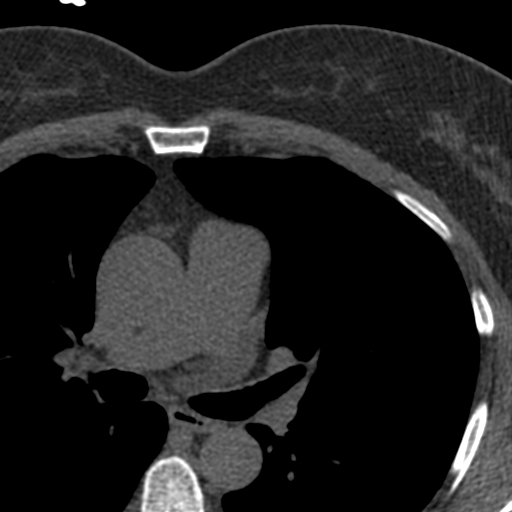

[Series 3: cascseq 2.0 bf37 st · axial · 0.72mm/px · z∈[-220,-132]mm · 5 of 68 slices shown, 7 images]
[im 12/68  vessel]
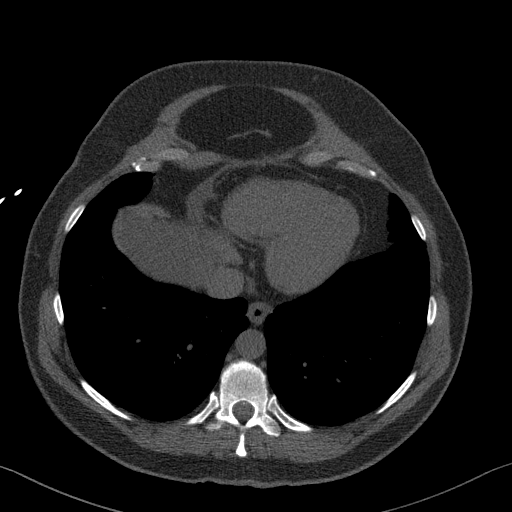
[im 12/68  lung]
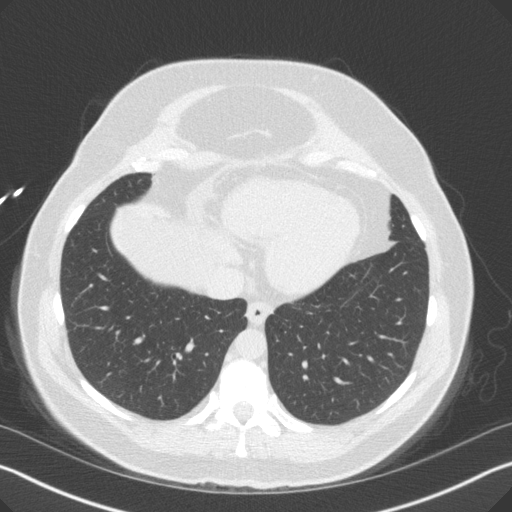
[im 23/68  vessel]
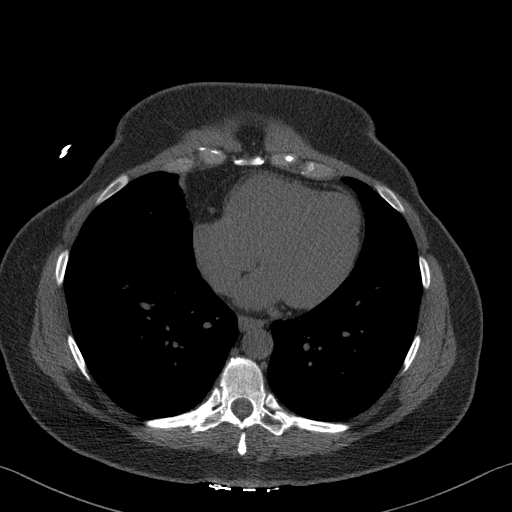
[im 34/68  vessel]
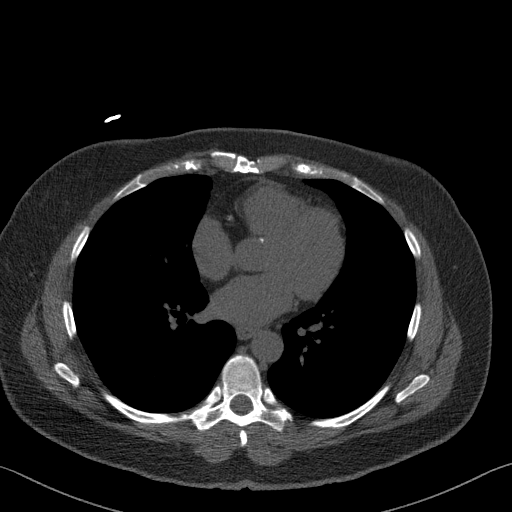
[im 45/68  vessel]
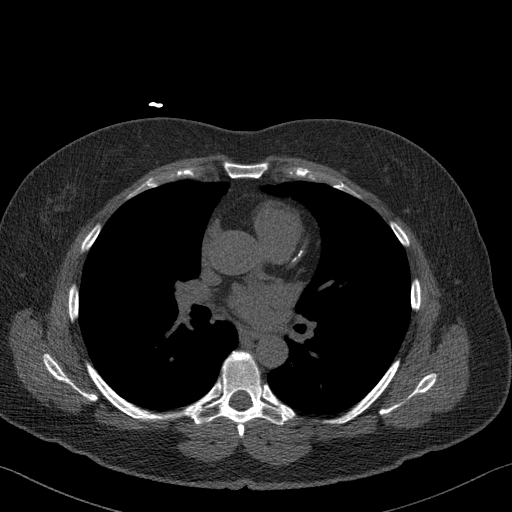
[im 56/68  vessel]
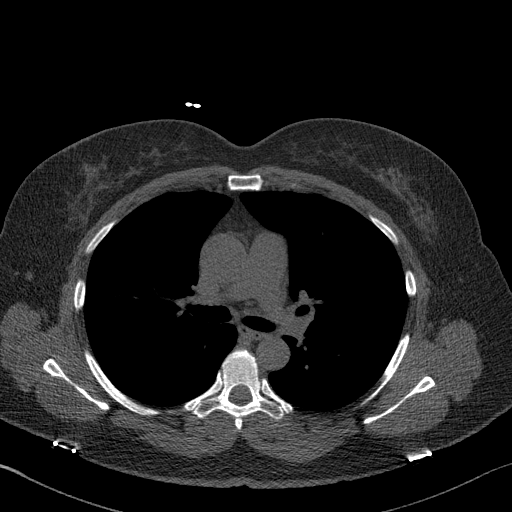
[im 56/68  lung]
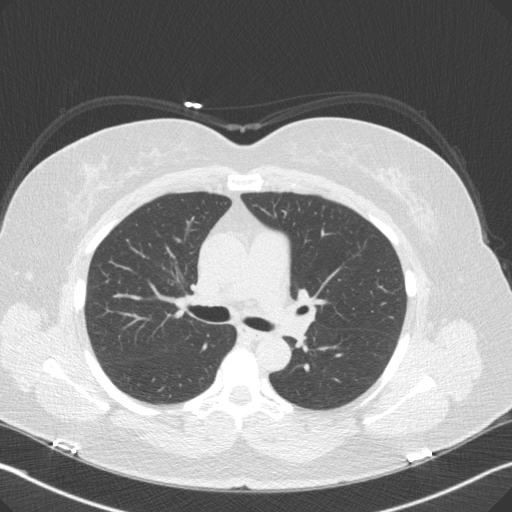

[Series 4: cascseq 2.0 br59 lung · axial · 0.72mm/px · z∈[-220,-132]mm · 5 of 68 slices shown]
[im 12/68  lung]
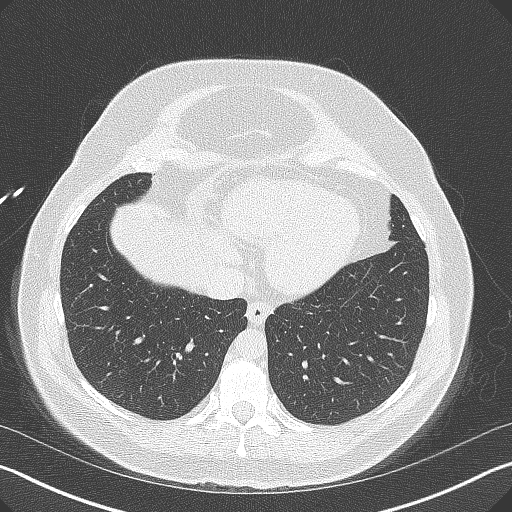
[im 23/68  lung]
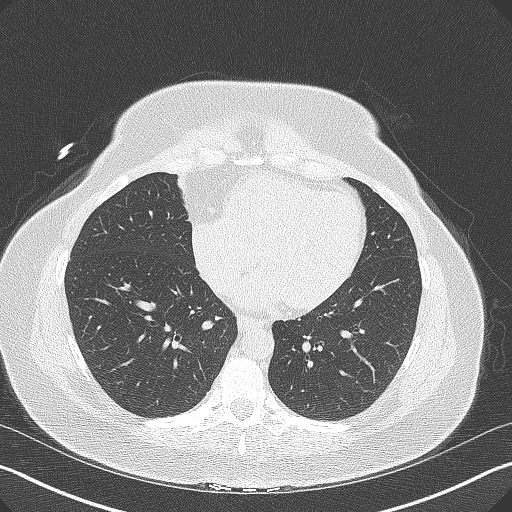
[im 34/68  lung]
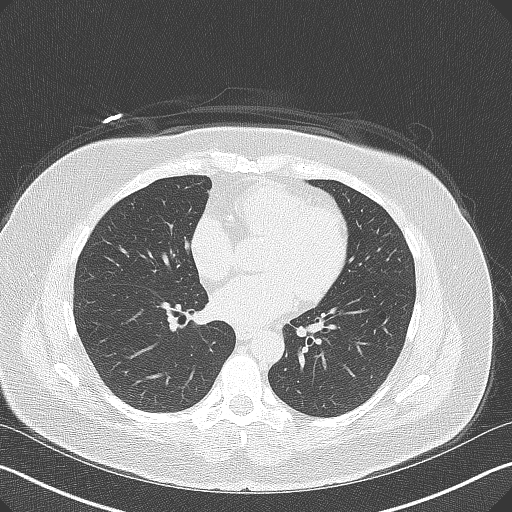
[im 45/68  lung]
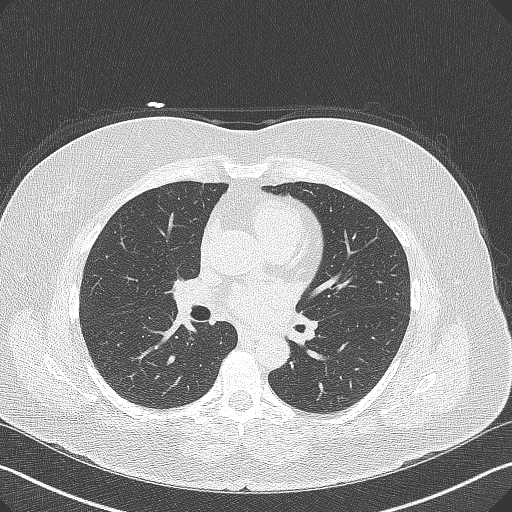
[im 56/68  lung]
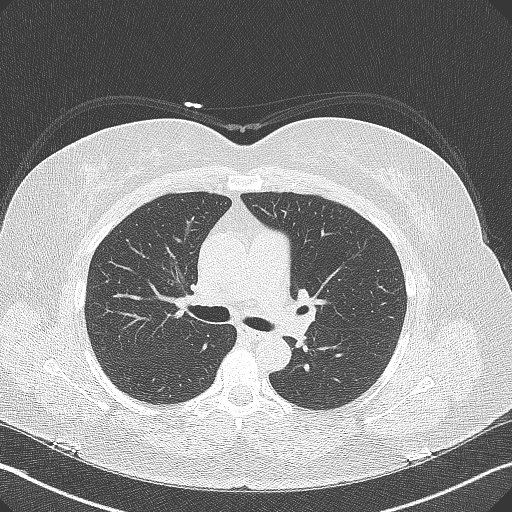

[14 of 20 positions shown; findings below may reference images not displayed]

FINDINGS: Vascular: Aortic atherosclerosis.

Mediastinum/Nodes: No imaged thoracic adenopathy.

Lungs/Pleura: No pleural fluid. Mild centrilobular emphysema. Clear
imaged lungs.

Upper Abdomen: Advanced hepatic steatosis.

Musculoskeletal: No acute osseous abnormality.
IMPRESSION: 1. No acute findings in the imaged extracardiac chest.
2. Hepatic steatosis.
3. Aortic Atherosclerosis (M87J8-KW0.0) and Emphysema (M87J8-CZT.5).
FINDINGS: Coronary arteries: Normal origins.

Coronary Calcium Score:

Left main: 0

Left anterior descending artery:

Left circumflex artery: 0

Right coronary artery:

Total:

Percentile: 84th

Pericardium: Normal.

Ascending Aorta: Normal caliber. Ascending aorta measures
approximately 35mm at the mid ascending aorta measured in an axial
plane.

Non-cardiac: See separate report from [REDACTED].
IMPRESSION: Coronary calcium score of 67.5. This was 84th percentile for age-,
race-, and sex-matched controls.



If CAC=0, it is reasonable to withhold statin therapy and reassess
in 5 to 10 years, as long as higher risk conditions are absent
(diabetes mellitus, family history of premature CHD in first degree
relatives (males <55 years; females <65 years), cigarette smoking,
or LDL >=190 mg/dL).

If CAC is 1 to 99, it is reasonable to initiate statin therapy for
patients >=55 years of age.

If CAC is >=100 or >=75th percentile, it is reasonable to initiate
statin therapy at any age.

Cardiology referral should be considered for patients with CAC
scores >=400 or >=75th percentile.

*2559 AHA/ACC/AACVPR/AAPA/ABC/PISTINJAT/SHAVON/GAUDET/Benedicto/VIDAD/BOBAN/GREGORYOBGYN
Guideline on the Management of Blood Cholesterol: A Report of the
American College of Cardiology/American Heart Association Task Force
on Clinical Practice Guidelines. J Am Coll Cardiol.
2760;73(24):1587-1864.

*** End of Addendum ***
EXAM:
OVER-READ INTERPRETATION  CT CHEST

The following report is an over-read performed by radiologist Dr.
Sendy Franko [REDACTED] on 04/11/2021. This over-read
does not include interpretation of cardiac or coronary anatomy or
pathology. The calcium score interpretation by the cardiologist is
attached.
FINDINGS: Vascular: Aortic atherosclerosis.

Mediastinum/Nodes: No imaged thoracic adenopathy.

Lungs/Pleura: No pleural fluid. Mild centrilobular emphysema. Clear
imaged lungs.

Upper Abdomen: Advanced hepatic steatosis.

Musculoskeletal: No acute osseous abnormality.
IMPRESSION: 1. No acute findings in the imaged extracardiac chest.
2. Hepatic steatosis.
3. Aortic Atherosclerosis (M87J8-KW0.0) and Emphysema (M87J8-CZT.5).

## 2023-09-18 ENCOUNTER — Ambulatory Visit
Admission: RE | Admit: 2023-09-18 | Discharge: 2023-09-18 | Disposition: A | Discharge status: Home / Self Care | Payer: 59 | Source: Ambulatory Visit | Attending: Physician Assistant | Admitting: Physician Assistant

## 2023-09-18 VITALS — BP 100/71 | HR 105 | Temp 100.0°F | Resp 20 | Ht 62.0 in | Wt 194.0 lb

## 2023-09-18 DIAGNOSIS — J101 Influenza due to other identified influenza virus with other respiratory manifestations: Secondary | ICD-10-CM

## 2023-09-18 LAB — POC COVID19/FLU A&B COMBO
Covid Antigen, POC: NEGATIVE
Influenza A Antigen, POC: POSITIVE — AB
Influenza B Antigen, POC: NEGATIVE

## 2023-09-18 MED ORDER — OSELTAMIVIR PHOSPHATE 75 MG PO CAPS: 75.0000 mg | ORAL_CAPSULE | Freq: Two times a day (BID) | ORAL | 0 refills | Status: AC

## 2023-09-18 NOTE — ED Provider Notes (Signed)
[facility]    CSN: [id number] Arrival date & time: 09/18/23  0935      History   Chief Complaint Chief Complaint  Patient presents with   Cough    HPI Brenda Robinson is a 63 y.o. female.   Patient here today for evaluation of fever, headache, sinus issues, ear ache, neck pain and pain between her shoulder blades that started a few days ago. She reports fever as well. She has had cough. She has taken OTC meds with mild relief.   The history is provided by the patient.  Cough Associated symptoms: fever and myalgias   Associated symptoms: no ear pain, no eye discharge, no shortness of breath, no sore throat and no wheezing     Past Medical History:  Diagnosis Date   Allergic rhinitis    Anxiety    Aortic atherosclerosis (HCC)    COVID    DM (diabetes mellitus) (HCC)    Ear ringing    Edema    Elevated LDL cholesterol level    Fluid retention    GERD (gastroesophageal reflux disease)    Hepatic steatosis    Hyperlipidemia    Hypertension    Morbid obesity (HCC)    Vitamin D deficiency     There are no active problems to display for this patient.   Past Surgical History:  Procedure Laterality Date   BACK SURGERY     CHOLECYSTECTOMY     HERNIA REPAIR     PARTIAL HYSTERECTOMY      OB History   No obstetric history on file.      Home Medications    Prior to Admission medications   Medication Sig Start Date End Date Taking? Authorizing Provider  atorvastatin (LIPITOR) 20 MG tablet Take 20 mg by mouth daily.   Yes [provider]  Cholecalciferol (VITAMIN D) 50 MCG (2000 UT) CAPS Take by mouth.   Yes [provider]  escitalopram (LEXAPRO) 10 MG tablet Take 10 mg by mouth daily.   Yes [provider]  oseltamivir (TAMIFLU) 75 MG capsule Take 1 capsule (75 mg total) by mouth every 12 (twelve) hours. 09/18/23  Yes Tomi Bamberger, PA-C  triamterene-hydrochlorothiazide (MAXZIDE-25) 37.5-25 MG tablet Take 1 tablet by  mouth daily.   Yes [provider]  albuterol (PROVENTIL HFA;VENTOLIN HFA) 108 (90 Base) MCG/ACT inhaler Inhale 1-2 puffs into the lungs every 6 (six) hours as needed for wheezing or shortness of breath. 08/15/18   Eustace Moore, MD  Ascorbic Acid (VITAMIN C) 100 MG tablet Take 100 mg by mouth daily.    [provider]  dextromethorphan-guaiFENesin (MUCINEX DM) 30-600 MG 12hr tablet Take 1 tablet by mouth 2 (two) times daily. Patient not taking: Reported on 03/28/2021 07/28/20   Wieters, Fran Lowes C, PA-C  doxycycline (VIBRAMYCIN) 100 MG capsule Take 1 capsule (100 mg total) by mouth 2 (two) times daily. Patient not taking: Reported on 03/28/2021 08/05/20   Wieters, Hallie C, PA-C  fluticasone (FLONASE) 50 MCG/ACT nasal spray Place into both nostrils daily.    [provider]  loratadine (CLARITIN) 10 MG tablet Take 10 mg by mouth daily.    [provider]  FLUoxetine (PROZAC) 10 MG tablet Take 10 mg by mouth daily.  07/28/20  [provider]    Family History Family History  Problem Relation Age of Onset   Heart attack Mother        cabg   Dementia Mother    Hypertension  Mother    Coronary artery disease Mother    Hypertension Sister    Coronary artery disease Maternal Grandmother    Hypertension Maternal Grandmother    Heart attack Maternal Grandmother    Coronary artery disease Maternal Grandfather    Heart attack Maternal Grandfather    Heart attack Paternal Uncle    Heart attack Paternal Uncle     Social History Social History   Tobacco Use   Smoking status: Former    Types: Cigarettes   Smokeless tobacco: Never  Vaping Use   Vaping status: Never Used  Substance Use Topics   Alcohol use: Not Currently    Comment: Occassionally   Drug use: Never     Allergies   Pristiq [desvenlafaxine], Rosuvastatin, Simvastatin, Wellbutrin [bupropion], and Prednisone   Review of Systems Review of Systems  Constitutional:  Positive for  fever.  HENT:  Positive for congestion and sinus pressure. Negative for ear pain and sore throat.   Eyes:  Negative for discharge and redness.  Respiratory:  Positive for cough. Negative for shortness of breath and wheezing.   Gastrointestinal:  Negative for abdominal pain, diarrhea, nausea and vomiting.  Musculoskeletal:  Positive for myalgias.     Physical Exam Triage Vital Signs ED Triage Vitals  Encounter Vitals Group     BP      Systolic BP Percentile      Diastolic BP Percentile      Pulse      Resp      Temp      Temp src      SpO2      Weight      Height      Head Circumference      Peak Flow      Pain Score      Pain Loc      Pain Education      Exclude from Growth Chart    No data found.  Updated Vital Signs BP 100/71 (BP Location: Left Arm)   Pulse (!) 105   Temp 100 F (37.8 C) (Oral)   Resp 20   Ht 5\' 2"  (1.575 m)   Wt 194 lb (88 kg)   SpO2 95%   BMI 35.48 kg/m   Visual Acuity Right Eye Distance:   Left Eye Distance:   Bilateral Distance:    Right Eye Near:   Left Eye Near:    Bilateral Near:     Physical Exam Vitals and nursing note reviewed.  Constitutional:      General: She is not in acute distress.    Appearance: Normal appearance. She is not ill-appearing.  HENT:     Head: Normocephalic and atraumatic.     Right Ear: Tympanic membrane normal.     Left Ear: Tympanic membrane normal.     Nose: Congestion present.     Mouth/Throat:     Mouth: Mucous membranes are moist.     Pharynx: No oropharyngeal exudate or posterior oropharyngeal erythema.  Eyes:     Conjunctiva/sclera: Conjunctivae normal.  Cardiovascular:     Rate and Rhythm: Normal rate and regular rhythm.     Heart sounds: Normal heart sounds. No murmur heard. Pulmonary:     Effort: Pulmonary effort is normal. No respiratory distress.     Breath sounds: Normal breath sounds. No wheezing, rhonchi or rales.  Skin:    General: Skin is warm and dry.  Neurological:      Mental Status: She is alert.  Psychiatric:  Mood and Affect: Mood normal.        Thought Content: Thought content normal.      UC Treatments / Results  Labs (all labs ordered are listed, but only abnormal results are displayed) Labs Reviewed  POC COVID19/FLU A&B COMBO - Abnormal; Notable for the following components:      Result Value   Influenza A Antigen, POC Positive (*)    All other components within normal limits    EKG   Radiology No results found.  Procedures Procedures (including critical care time)  Medications Ordered in UC Medications - No data to display  Initial Impression / Assessment and Plan / UC Course  I have reviewed the triage vital signs and the nursing notes.  Pertinent labs & imaging results that were available during my care of the patient were reviewed by me and considered in my medical decision making (see chart for details).    Flu test positive. Will treat with tamiflu and advised symptomatic treatment, increased fluids and rest with follow up if no gradual improvement or with any further concerns.   Final Clinical Impressions(s) / UC Diagnoses   Final diagnoses:  Influenza A   Discharge Instructions   None    ED Prescriptions     Medication Sig Dispense Auth. Provider   oseltamivir (TAMIFLU) 75 MG capsule Take 1 capsule (75 mg total) by mouth every 12 (twelve) hours. 10 capsule Tomi Bamberger, PA-C      PDMP not reviewed this encounter.   Tomi Bamberger, PA-C 09/21/23 2008

## 2023-09-18 NOTE — ED Triage Notes (Signed)
headache, earache, pain between shoulder blades, fever 100.6 - Entered by patient  "Started with sinus problems, ha, facial pain, ear pain, neck pain with soreness between shoulder blades, cough with soreness in my chest after I cough". Last known Fever (yesterday around 12N, 100.6).

## 2023-10-18 ENCOUNTER — Telehealth (INDEPENDENT_AMBULATORY_CARE_PROVIDER_SITE_OTHER): Payer: Self-pay | Admitting: Audiology

## 2023-10-18 NOTE — Telephone Encounter (Signed)
 Reminder Call:  Date: 10/19/2023 Status: Sch  Time: 2:00 PM Patient called in to confirm location for both appts- provided her location-[id number] [place], [place] [address]

## 2023-10-19 ENCOUNTER — Ambulatory Visit (INDEPENDENT_AMBULATORY_CARE_PROVIDER_SITE_OTHER): Payer: 59 | Admitting: Audiology

## 2023-10-19 ENCOUNTER — Encounter (INDEPENDENT_AMBULATORY_CARE_PROVIDER_SITE_OTHER): Payer: Self-pay

## 2023-10-19 ENCOUNTER — Ambulatory Visit (INDEPENDENT_AMBULATORY_CARE_PROVIDER_SITE_OTHER): Payer: 59 | Admitting: Otolaryngology

## 2023-10-19 VITALS — BP 143/84 | HR 63 | Ht 62.0 in | Wt 197.0 lb

## 2023-10-19 DIAGNOSIS — H90A32 Mixed conductive and sensorineural hearing loss, unilateral, left ear with restricted hearing on the contralateral side: Secondary | ICD-10-CM | POA: Diagnosis not present

## 2023-10-19 DIAGNOSIS — H9312 Tinnitus, left ear: Secondary | ICD-10-CM | POA: Diagnosis not present

## 2023-10-19 DIAGNOSIS — H9041 Sensorineural hearing loss, unilateral, right ear, with unrestricted hearing on the contralateral side: Secondary | ICD-10-CM | POA: Diagnosis not present

## 2023-10-19 DIAGNOSIS — H9012 Conductive hearing loss, unilateral, left ear, with unrestricted hearing on the contralateral side: Secondary | ICD-10-CM

## 2023-10-19 NOTE — Progress Notes (Signed)
 [address], [address] [place], [place] [address] [phone]  Audiological Evaluation    Name: Brenda Robinson     DOB:   1961/06/25      MRN:   [id number]                                                                                     Service Date: 10/19/2023     Accompanied by: unaccompanied    Patient comes today after Dr. Suszanne Conners, ENT sent a referral for a hearing evaluation due to concerns with tinnitus.   Symptoms Yes Details  Hearing loss  [x]  Notes from [facility] - Test completed around September 2015 "Pure tone audiometry reveals normal hearing in the right ear and normal hearing in the left ear in the low frequencies with down-sloping in the middle frequencies to moderate high frequency conductive loss. Speech discrimination is normal bilaterally. Tympanograms are type A in the right ear and type Ad in the left ear. Acoustic reflexes are present bilaterally. "  Tinnitus  [x]  Both ears, bothersome and constant  Reports tonality and rhythm changes if she moves her head or touches her face.  Ear pain/ infections/pressure  []    Balance problems  [x]  Off balance/unsteady when changes positions quickly  Noise exposure history  []    Previous ear surgeries  []    Family history of hearing loss  [x]  Mother and sister in adulthood  Amplification  []    Other  []      Otoscopy: Right ear: Clear external ear canals and notable landmarks visualized on the tympanic membrane. Left ear:  Clear external ear canals and notable landmarks visualized on the tympanic membrane.  Tympanometry: Equipment for acoustic reflexes not available in the clinic.  Right ear: Type Ad- Normal external ear canal volume with normal middle ear pressure and high tympanic membrane compliance Left ear: Type Ad- Normal external ear canal volume with normal middle ear pressure and high tympanic membrane compliance  Distortion Product Otoacoustic Emissions: Equipment not available.  Pure tone  Audiometry: Right ear- Normal hearing from [phone] Hz then mild to moderate presumably  sensorineural hearing loss from 6000 Hz - 8000 Hz. Left ear-  Normal sloping to profound mixed hearing loss from [phone] Hz    Speech Audiometry: Right ear- Speech Reception Threshold (SRT) was obtained at 10 dBHL. Left ear-Speech Reception Threshold (SRT) was obtained at 30 dBHL.   Word Recognition Score Tested using NU-6 (MLV) Right ear: 96% was obtained at a presentation level of 60 dBHL with contralateral masking which is deemed as  excellent. Left ear: 76% was obtained at a presentation level of 70 dBHL with contralateral masking which is deemed as  fair.   The hearing test results were completed under headphones and results are deemed to be of good reliability. Test technique:  conventional      Recommendations: Follow up with ENT as scheduled for today. Return for a hearing evaluation if concerns with hearing changes arise or per MD recommendation. Consider various tinnitus strategies, including the use of a sound generator, hearing aids, and/or tinnitus retraining therapy.  Consider a communication needs assessment after medical clearance for hearing aids  is obtained.   [name], AUD

## 2023-10-20 DIAGNOSIS — H9312 Tinnitus, left ear: Secondary | ICD-10-CM | POA: Insufficient documentation

## 2023-10-20 DIAGNOSIS — H9012 Conductive hearing loss, unilateral, left ear, with unrestricted hearing on the contralateral side: Secondary | ICD-10-CM | POA: Insufficient documentation

## 2023-10-20 NOTE — Progress Notes (Signed)
 Patient ID: Brenda Robinson, female   DOB: 1961/02/13, 63 y.o.   MRN: [id number]  CC: Left ear tinnitus  HPI:  Brenda Robinson is a 63 y.o. female who presents today complaining of left ear tinnitus for the past 10+ years.  She describes her tinnitus as a constant high-pitched ringing noise.  She denies any otalgia, otorrhea, or vertigo.  She has no recent otitis media or otitis externa.  She also denies any history of otologic surgery.  Over the past several years, she has noted difficulty hearing on the left side.  She denies any family history of otosclerosis.  Past Medical History:  Diagnosis Date   Allergic rhinitis    Anxiety    Aortic atherosclerosis (HCC)    COVID    DM (diabetes mellitus) (HCC)    Ear ringing    Edema    Elevated LDL cholesterol level    Fluid retention    GERD (gastroesophageal reflux disease)    Hepatic steatosis    Hyperlipidemia    Hypertension    Morbid obesity (HCC)    Vitamin D deficiency     Past Surgical History:  Procedure Laterality Date   BACK SURGERY     CHOLECYSTECTOMY     HERNIA REPAIR     PARTIAL HYSTERECTOMY      Family History  Problem Relation Age of Onset   Heart attack Mother        cabg   Dementia Mother    Hypertension Mother    Coronary artery disease Mother    Hypertension Sister    Coronary artery disease Maternal Grandmother    Hypertension Maternal Grandmother    Heart attack Maternal Grandmother    Coronary artery disease Maternal Grandfather    Heart attack Maternal Grandfather    Heart attack Paternal Uncle    Heart attack Paternal Uncle     Social History:  reports that she has quit smoking. Her smoking use included cigarettes. She has never used smokeless tobacco. She reports that she does not currently use alcohol. She reports that she does not use drugs.  Allergies:  Allergies  Allergen Reactions   Pristiq [Desvenlafaxine] Other (See Comments)    Hair loss   Rosuvastatin Other (See Comments)     Myalgias    Simvastatin Other (See Comments)    Leg achiness   Wellbutrin [Bupropion] Rash   Prednisone Other (See Comments)    insomnia    Prior to Admission medications   Medication Sig Start Date End Date Taking? Authorizing Provider  Ascorbic Acid (VITAMIN C) 100 MG tablet Take 100 mg by mouth daily.   Yes [provider]  atorvastatin (LIPITOR) 20 MG tablet Take 20 mg by mouth daily.   Yes [provider]  Cholecalciferol (VITAMIN D) 50 MCG (2000 UT) CAPS Take by mouth.   Yes [provider]  escitalopram (LEXAPRO) 10 MG tablet Take 10 mg by mouth daily.   Yes [provider]  fluticasone (FLONASE) 50 MCG/ACT nasal spray Place into both nostrils daily.   Yes [provider]  loratadine (CLARITIN) 10 MG tablet Take 10 mg by mouth daily.   Yes [provider]  magnesium (MAGTAB) 84 MG ( ) TBCR SR tablet Take 84 mg by mouth daily.   Yes [provider]  triamterene-hydrochlorothiazide (MAXZIDE-25) 37.5-25 MG tablet Take 1 tablet by mouth daily.   Yes [provider]  albuterol (PROVENTIL HFA;VENTOLIN HFA) 108 (90 Base) MCG/ACT inhaler Inhale 1-2 puffs into the  lungs every 6 (six) hours as needed for wheezing or shortness of breath. 08/15/18   Eustace Moore, MD  dextromethorphan-guaiFENesin [facility] DM) 30-600 MG 12hr tablet Take 1 tablet by mouth 2 (two) times daily. Patient not taking: Reported on 10/19/2023 07/28/20   Wieters, Fran Lowes C, PA-C  doxycycline (VIBRAMYCIN) 100 MG capsule Take 1 capsule (100 mg total) by mouth 2 (two) times daily. Patient not taking: Reported on 10/19/2023 08/05/20   Wieters, Fran Lowes C, PA-C  oseltamivir (TAMIFLU) 75 MG capsule Take 1 capsule (75 mg total) by mouth every 12 (twelve) hours. Patient not taking: Reported on 10/19/2023 09/18/23   Tomi Bamberger, PA-C  FLUoxetine (PROZAC) 10 MG tablet Take 10 mg by mouth daily.  07/28/20  [provider]    Blood pressure (!)  143/84, pulse 63, height 5\' 2"  (1.575 m), weight 197 lb (89.4 kg), SpO2 94%. Exam: General: Communicates without difficulty, well nourished, no acute distress. Head: Normocephalic, no evidence injury, no tenderness, facial buttresses intact without stepoff. Face/sinus: No tenderness to palpation and percussion. Facial movement is normal and symmetric. Eyes: PERRL, EOMI. No scleral icterus, conjunctivae clear. Neuro: CN II exam reveals vision grossly intact.  No nystagmus at any point of gaze. Ears: Auricles well formed without lesions.  Ear canals are intact without mass or lesion.  No erythema or edema is appreciated.  The TMs are intact without fluid. Nose: External evaluation reveals normal support and skin without lesions.  Dorsum is intact.  Anterior rhinoscopy reveals normal mucosa over anterior aspect of inferior turbinates and intact septum.  No purulence noted. Oral:  Oral cavity and oropharynx are intact, symmetric, without erythema or edema.  Mucosa is moist without lesions. Neck: Full range of motion without pain.  There is no significant lymphadenopathy.  No masses palpable.  Thyroid bed within normal limits to palpation.  Parotid glands and submandibular glands equal bilaterally without mass.  Trachea is midline. Neuro:  CN 2-12 grossly intact.   Her hearing test shows asymmetric left ear conductive hearing loss.  Assessment: 1.  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 2.  The patient's ear canals, tympanic membranes, and middle ear spaces are all normal.  No middle ear effusion or infection is noted. 3.  The patient's left ear tinnitus is likely a direct result of the left ear hearing loss.  Plan: 1.  The physical exam findings and the hearing test results are reviewed with the patient. 2.  The strategies to cope with tinnitus, including the use of masker, hearing aids, tinnitus retraining therapy, and avoidance of caffeine and alcohol are  discussed. 3.  The treatment options for otosclerosis are discussed.  Options include conservative observation, hearing amplification, and stapedectomy surgery.  The pros and cons of the treatment options are reviewed. 4.  The patient will return for reevaluation in 6 months.  Stafford Riviera W Allen Egerton 10/20/2023, 10:34 AM

## 2024-03-28 ENCOUNTER — Other Ambulatory Visit: Payer: Self-pay | Admitting: Gastroenterology

## 2024-03-28 DIAGNOSIS — K760 Fatty (change of) liver, not elsewhere classified: Secondary | ICD-10-CM

## 2024-04-20 ENCOUNTER — Other Ambulatory Visit

## 2024-04-26 ENCOUNTER — Ambulatory Visit (INDEPENDENT_AMBULATORY_CARE_PROVIDER_SITE_OTHER): Admitting: Otolaryngology

## 2024-05-09 ENCOUNTER — Ambulatory Visit
Admission: RE | Admit: 2024-05-09 | Discharge: 2024-05-09 | Disposition: A | Discharge status: Home / Self Care | Source: Ambulatory Visit | Attending: Gastroenterology | Admitting: Gastroenterology

## 2024-05-09 DIAGNOSIS — K760 Fatty (change of) liver, not elsewhere classified: Secondary | ICD-10-CM

## 2024-06-21 ENCOUNTER — Other Ambulatory Visit: Payer: Self-pay | Admitting: Gastroenterology

## 2024-06-21 DIAGNOSIS — R1033 Periumbilical pain: Secondary | ICD-10-CM

## 2024-08-06 ENCOUNTER — Ambulatory Visit
Admission: RE | Admit: 2024-08-06 | Discharge: 2024-08-06 | Disposition: A | Discharge status: Home / Self Care | Payer: Self-pay | Source: Ambulatory Visit

## 2024-08-06 ENCOUNTER — Ambulatory Visit: Admitting: Radiology

## 2024-08-06 VITALS — BP 123/83 | HR 81 | Temp 98.0°F | Resp 17

## 2024-08-06 DIAGNOSIS — J209 Acute bronchitis, unspecified: Secondary | ICD-10-CM | POA: Diagnosis not present

## 2024-08-06 DIAGNOSIS — R051 Acute cough: Secondary | ICD-10-CM

## 2024-08-06 MED ORDER — DEXAMETHASONE SOD PHOSPHATE PF 10 MG/ML IJ SOLN
10.0000 mg | Freq: Once | INTRAMUSCULAR | Status: AC
  Administered 2024-08-06: 10 mg via INTRAMUSCULAR

## 2024-08-06 MED ORDER — PROMETHAZINE-DM 6.25-15 MG/5ML PO SYRP: 5.0000 mL | ORAL_SOLUTION | Freq: Three times a day (TID) | ORAL | 0 refills | Status: AC | PRN

## 2024-08-06 NOTE — ED Triage Notes (Signed)
 Pt c/o cough, sinus pressure, and congestion since weekend after christmas.  She did an E visit on 12/30 and was given amoxicillin . States it is not helping and she is feeling worse.

## 2024-08-06 NOTE — ED Provider Notes (Signed)
 " [facility]    CSN: [id number] Arrival date & time: 08/06/24  1140      History   Chief Complaint Chief Complaint  Patient presents with   Cough    Did a doc on demand 08/01/24, taking Amoxicillin -Clav 875 - 125MG  and doesn't seem to be working. - Entered by patient    HPI Brenda Robinson is a 64 y.o. female.   64 year old female presents urgent care with complaints of persistent cough.  Her symptoms started right after Christmas.  Her cough is especially bad at night but can be very persistent during the day as well.  She did an e-visit on December 30 and was started on Augmentin and given Tessalon .  She reports that her symptoms have not improved.  She reports that the Tessalon  works only for about an hour.  She denies any fevers, shortness of breath, chest pain.  She does have a history of bronchitis occurring during the winter requiring steroids but reports that she is unable to take prednisone  but can do injectable steroids.  She has tolerated the several times in the past.   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      Home Medications    Prior to Admission medications   Medication Sig Start Date End Date Taking? Authorizing Provider  amoxicillin -clavulanate (AUGMENTIN) 875-125 MG tablet Take 1 tablet by mouth 2 (two) times daily. 08/01/24  Yes [provider]  promethazine -dextromethorphan (PROMETHAZINE -DM) 6.25-15 MG/5ML syrup Take 5 mLs by mouth every 8 (eight) hours as needed for cough. 08/06/24  Yes Chloeann Alfred A, PA-C  albuterol  (PROVENTIL  HFA;VENTOLIN  HFA) 108 (90 Base) MCG/ACT inhaler Inhale 1-2 puffs into the lungs every 6 (six) hours as needed for wheezing or shortness of breath. 08/15/18   Maranda Jamee Jacob, MD  Ascorbic Acid (VITAMIN C) 100 MG tablet Take 100 mg by mouth daily.    [provider]  atorvastatin (LIPITOR) 20 MG tablet Take 20 mg by mouth daily.    [provider]  Cholecalciferol (VITAMIN D) 50 MCG (2000 UT) CAPS Take by mouth.    [provider]  dextromethorphan-guaiFENesin  (MUCINEX  DM) 30-600 MG 12hr tablet Take 1 tablet by mouth 2 (two) times daily. Patient not taking: Reported on 10/19/2023 07/28/20   Wieters, Hallie C, PA-C  doxycycline  (VIBRAMYCIN ) 100 MG capsule Take 1 capsule (100 mg total) by mouth 2 (two) times daily. Patient not taking: Reported on 10/19/2023 08/05/20  Wieters, Hallie C, PA-C  escitalopram (LEXAPRO) 10 MG tablet Take 10 mg by mouth daily.    [provider]  fluticasone (FLONASE) 50 MCG/ACT nasal spray Place into both nostrils daily.    [provider]  loratadine (CLARITIN) 10 MG tablet Take 10 mg by mouth daily.    [provider]  magnesium (MAGTAB) 84 MG ( ) TBCR SR tablet Take 84 mg by mouth daily.    [provider]  oseltamivir  (TAMIFLU ) 75 MG capsule Take 1 capsule (75 mg total) by mouth every 12 (twelve) hours. Patient not taking: Reported on 10/19/2023 09/18/23   Billy Asberry FALCON, PA-C  triamterene-hydrochlorothiazide (MAXZIDE-25) 37.5-25 MG tablet Take 1 tablet by mouth daily.    [provider]  FLUoxetine (PROZAC)  10 MG tablet Take 10 mg by mouth daily.  07/28/20  [provider]    Family History Family History  Problem Relation Age of Onset   Heart attack Mother        cabg   Dementia Mother    Hypertension Mother    Coronary artery disease Mother    Hypertension Sister    Coronary artery disease Maternal Grandmother    Hypertension Maternal Grandmother    Heart attack Maternal Grandmother    Coronary artery disease Maternal Grandfather    Heart attack Maternal Grandfather    Heart attack Paternal Uncle    Heart attack Paternal Uncle     Social History Social History[1]   Allergies   Pristiq [desvenlafaxine], Rosuvastatin, Simvastatin, Wellbutrin [bupropion], and Prednisone    Review of Systems Review of Systems  Constitutional:  Negative for chills and fever.  HENT:  Negative for ear pain and sore throat.   Eyes:  Negative for pain and visual disturbance.  Respiratory:  Positive for cough. Negative for shortness of breath.   Cardiovascular:  Negative for chest pain and palpitations.  Gastrointestinal:  Negative for abdominal pain and vomiting.  Genitourinary:  Negative for dysuria and hematuria.  Musculoskeletal:  Negative for arthralgias and back pain.  Skin:  Negative for color change and rash.  Neurological:  Negative for seizures and syncope.  All other systems reviewed and are negative.    Physical Exam Triage Vital Signs ED Triage Vitals  Encounter Vitals Group     BP 08/06/24 1200 123/83     Girls Systolic BP Percentile --      Girls Diastolic BP Percentile --      Boys Systolic BP Percentile --      Boys Diastolic BP Percentile --      Pulse Rate 08/06/24 1200 81     Resp 08/06/24 1200 17     Temp 08/06/24 1200 98 F (36.7 C)     Temp Source 08/06/24 1200 Oral     SpO2 08/06/24 1200 94 %     Weight --      Height --      Head Circumference --      Peak Flow --      Pain Score 08/06/24 1206 0     Pain Loc --      Pain Education --       Exclude from Growth Chart --    No data found.  Updated Vital Signs BP 123/83 (BP Location: Right Arm)   Pulse 81   Temp 98 F (36.7 C) (Oral)   Resp 17   SpO2 94%   Visual Acuity Right Eye Distance:   Left Eye Distance:   Bilateral Distance:  Right Eye Near:   Left Eye Near:    Bilateral Near:     Physical Exam Vitals and nursing note reviewed.  Constitutional:      General: She is not in acute distress.    Appearance: She is well-developed.  HENT:     Head: Normocephalic and atraumatic.  Eyes:     Conjunctiva/sclera: Conjunctivae normal.  Cardiovascular:     Rate and Rhythm: Normal rate and regular rhythm.     Heart sounds: No murmur heard. Pulmonary:     Effort: Pulmonary effort is normal. No tachypnea or respiratory distress.     Breath sounds: No decreased breath sounds, wheezing or rhonchi.     Comments: Crackle heard within the left upper lung Abdominal:     Palpations: Abdomen is soft.     Tenderness: There is no abdominal tenderness.  Musculoskeletal:        General: No swelling.     Cervical back: Neck supple.  Skin:    General: Skin is warm and dry.     Capillary Refill: Capillary refill takes less than 2 seconds.  Neurological:     Mental Status: She is alert.  Psychiatric:        Mood and Affect: Mood normal.      UC Treatments / Results  Labs (all labs ordered are listed, but only abnormal results are displayed) Labs Reviewed - No data to display  EKG   Radiology DG Chest 2 View Result Date: 08/06/2024 CLINICAL DATA:  Cough and congestion. EXAM: CHEST - 2 VIEW COMPARISON:  07/28/2020 FINDINGS: The heart size and mediastinal contours are within normal limits. There is no evidence of pulmonary edema, consolidation, pneumothorax, nodule or pleural fluid. The visualized skeletal structures are unremarkable. IMPRESSION: No active cardiopulmonary disease. Electronically Signed   By: Marcey Moan M.D.   On: 08/06/2024 12:42     Procedures Procedures (including critical care time)  Medications Ordered in UC Medications  dexamethasone  (DECADRON ) injection 10 mg (10 mg Intramuscular Given 08/06/24 1240)    Initial Impression / Assessment and Plan / UC Course  I have reviewed the triage vital signs and the nursing notes.  Pertinent labs & imaging results that were available during my care of the patient were reviewed by me and considered in my medical decision making (see chart for details).     Acute bronchitis, unspecified organism  Acute cough - Plan: DG Chest 2 View, DG Chest 2 View   Chest x-ray done today.  Final evaluation with the radiologist does not show any acute findings. Symptoms and physical exam findings are most consistent with an acute bronchitis.  This is inflammation within the airways.  Recommend continuing current antibiotic course and we have given an injection of steroids.  We will also send in a different medication to help with the cough.  We recommend the following: Decadron  injection given today. This is a steroid to help with inflammation. Promethazine  DM 5 mL every 8 hours as needed for cough.  Use caution as this medication can cause drowsiness.  Finish out course of antibiotics as prescribed Can continue Tessalon  if needed for cough during the day Make sure to stay hydrated by drinking plenty of water. Return to urgent care or PCP if symptoms worsen or fail to resolve.    Final Clinical Impressions(s) / UC Diagnoses   Final diagnoses:  Acute cough  Acute bronchitis, unspecified organism     Discharge Instructions      Chest x-ray done  today.  Final evaluation with the radiologist does not show any acute findings. Symptoms and physical exam findings are most consistent with an acute bronchitis.  This is inflammation within the airways.  Recommend continuing current antibiotic course and we have given an injection of steroids.  We will also send in a different medication to  help with the cough.  We recommend the following: Decadron  injection given today. This is a steroid to help with inflammation. Promethazine  DM 5 mL every 8 hours as needed for cough.  Use caution as this medication can cause drowsiness.  Finish out course of antibiotics as prescribed Can continue Tessalon  if needed for cough during the day Make sure to stay hydrated by drinking plenty of water. Return to urgent care or PCP if symptoms worsen or fail to resolve.       ED Prescriptions     Medication Sig Dispense Auth. Provider   promethazine -dextromethorphan (PROMETHAZINE -DM) 6.25-15 MG/5ML syrup Take 5 mLs by mouth every 8 (eight) hours as needed for cough. 180 mL Teresa Almarie LABOR, [place]      PDMP not reviewed this encounter.     [1]  Social History Tobacco Use   Smoking status: Former    Types: Cigarettes   Smokeless tobacco: Never  Vaping Use   Vaping status: Never Used  Substance Use Topics   Alcohol use: Not Currently    Comment: Occassionally   Drug use: Never     Teresa Almarie LABOR, PA-C 08/06/24 1253  "

## 2024-08-06 NOTE — Discharge Instructions (Addendum)
 Chest x-ray done today.  Final evaluation with the radiologist does not show any acute findings. Symptoms and physical exam findings are most consistent with an acute bronchitis.  This is inflammation within the airways.  Recommend continuing current antibiotic course and we have given an injection of steroids.  We will also send in a different medication to help with the cough.  We recommend the following: Decadron  injection given today. This is a steroid to help with inflammation. Promethazine  DM 5 mL every 8 hours as needed for cough.  Use caution as this medication can cause drowsiness.  Finish out course of antibiotics as prescribed Can continue Tessalon  if needed for cough during the day Make sure to stay hydrated by drinking plenty of water. Return to urgent care or PCP if symptoms worsen or fail to resolve.
